# Patient Record
Sex: Male | Born: 1950
Health system: Southern US, Community
[De-identification: ages and names within clinical notes are randomized; demographics above are authoritative.]

## PROBLEM LIST (undated history)

## (undated) DIAGNOSIS — F32A Depression, unspecified: Secondary | ICD-10-CM

## (undated) DIAGNOSIS — M459 Ankylosing spondylitis of unspecified sites in spine: Secondary | ICD-10-CM

## (undated) DIAGNOSIS — I10 Essential (primary) hypertension: Secondary | ICD-10-CM

## (undated) DIAGNOSIS — F329 Major depressive disorder, single episode, unspecified: Secondary | ICD-10-CM

## (undated) DIAGNOSIS — E079 Disorder of thyroid, unspecified: Secondary | ICD-10-CM

## (undated) HISTORY — DX: Depression, unspecified: F32.A

## (undated) HISTORY — DX: Essential (primary) hypertension: I10

## (undated) HISTORY — DX: Major depressive disorder, single episode, unspecified: F32.9

## (undated) HISTORY — DX: Ankylosing spondylitis of unspecified sites in spine: M45.9

## (undated) HISTORY — PX: COLONOSCOPY: SHX174

## (undated) HISTORY — DX: Disorder of thyroid, unspecified: E07.9

---
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
## 1998-03-12 ENCOUNTER — Encounter: Payer: Self-pay | Admitting: Emergency Medicine

## 1998-03-12 ENCOUNTER — Emergency Department (HOSPITAL_COMMUNITY): Admission: EM | Admit: 1998-03-12 | Discharge: 1998-03-12 | Payer: Self-pay

## 1998-06-01 ENCOUNTER — Ambulatory Visit: Admission: RE | Admit: 1998-06-01 | Discharge: 1998-06-01 | Payer: Self-pay | Admitting: Internal Medicine

## 1998-07-20 ENCOUNTER — Emergency Department (HOSPITAL_COMMUNITY): Admission: EM | Admit: 1998-07-20 | Discharge: 1998-07-20 | Payer: Self-pay | Admitting: Emergency Medicine

## 2001-12-09 DIAGNOSIS — K648 Other hemorrhoids: Secondary | ICD-10-CM | POA: Insufficient documentation

## 2005-05-20 ENCOUNTER — Encounter: Admission: RE | Admit: 2005-05-20 | Discharge: 2005-05-20 | Payer: Self-pay | Admitting: Family Medicine

## 2005-08-03 ENCOUNTER — Ambulatory Visit: Payer: Self-pay | Admitting: Family Medicine

## 2005-09-24 ENCOUNTER — Ambulatory Visit: Payer: Self-pay | Admitting: Gastroenterology

## 2005-09-24 DIAGNOSIS — M459 Ankylosing spondylitis of unspecified sites in spine: Secondary | ICD-10-CM

## 2005-09-30 ENCOUNTER — Ambulatory Visit: Payer: Self-pay | Admitting: Gastroenterology

## 2005-09-30 DIAGNOSIS — K589 Irritable bowel syndrome without diarrhea: Secondary | ICD-10-CM | POA: Insufficient documentation

## 2006-10-28 ENCOUNTER — Ambulatory Visit: Payer: Self-pay | Admitting: Family Medicine

## 2006-11-22 ENCOUNTER — Encounter: Admission: RE | Admit: 2006-11-22 | Discharge: 2006-11-22 | Payer: Self-pay | Admitting: Family Medicine

## 2006-12-14 ENCOUNTER — Ambulatory Visit: Payer: Self-pay | Admitting: Gastroenterology

## 2007-04-12 DIAGNOSIS — G4733 Obstructive sleep apnea (adult) (pediatric): Secondary | ICD-10-CM

## 2007-04-12 DIAGNOSIS — F329 Major depressive disorder, single episode, unspecified: Secondary | ICD-10-CM

## 2007-04-12 DIAGNOSIS — I10 Essential (primary) hypertension: Secondary | ICD-10-CM

## 2007-04-12 DIAGNOSIS — Z87442 Personal history of urinary calculi: Secondary | ICD-10-CM

## 2007-07-01 ENCOUNTER — Ambulatory Visit: Payer: Self-pay | Admitting: Family Medicine

## 2008-04-19 ENCOUNTER — Ambulatory Visit: Payer: Self-pay | Admitting: Family Medicine

## 2008-05-28 ENCOUNTER — Ambulatory Visit: Payer: Self-pay | Admitting: Family Medicine

## 2009-01-22 ENCOUNTER — Ambulatory Visit: Payer: Self-pay | Admitting: Family Medicine

## 2009-02-06 IMAGING — CT CT ABDOMEN W/ CM
2 of 5 series · 17 of 46 positions shown, 19 images · IV contrast (30CC OMNI 350 & [ID] OMNI 300)
Comparison: None.

CLINICAL DATA: Abdominal pain, mid to left side.  
 CT ABDOMEN WITH CONTRAST:
TECHNIQUE: Multidetector CT imaging of the abdomen was performed following the standard protocol during bolus administration of intravenous contrast.
 Contrast:  685cc Omnipaque 300.

[Series 2: abdomen w/ · axial · 0.89mm/px · z∈[-269,+56]mm · 14 of 75 slices shown, 16 images]
[im 5/75  soft-tissue]
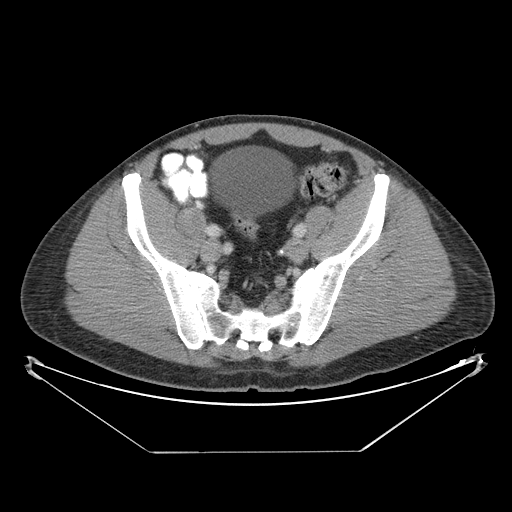
[im 5/75  bone]
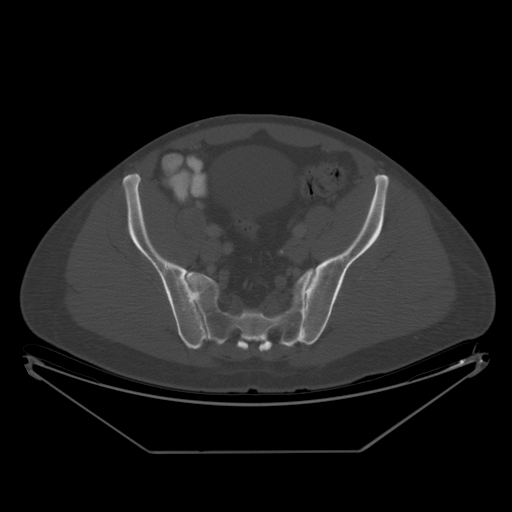
[im 9/75  soft-tissue]
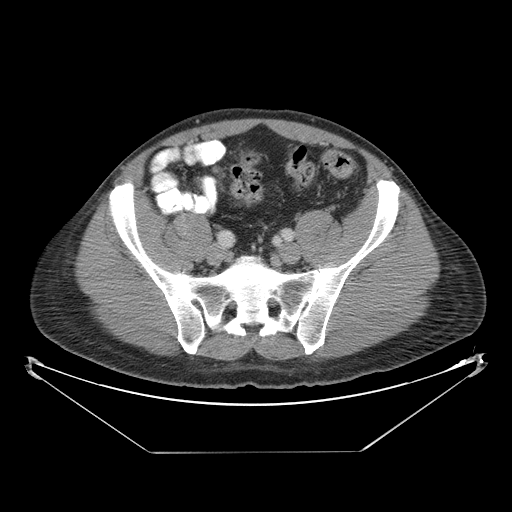
[im 14/75  soft-tissue]
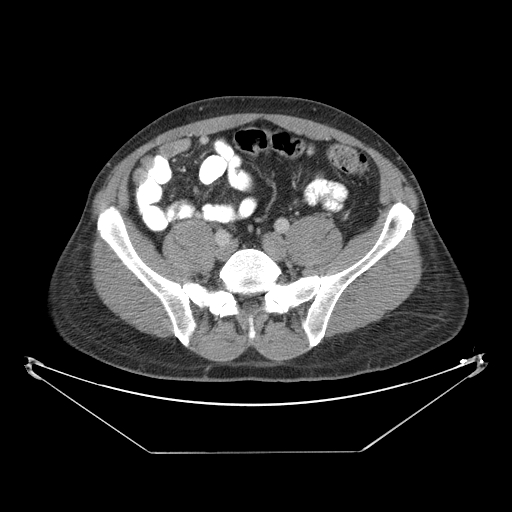
[im 22/75  soft-tissue]
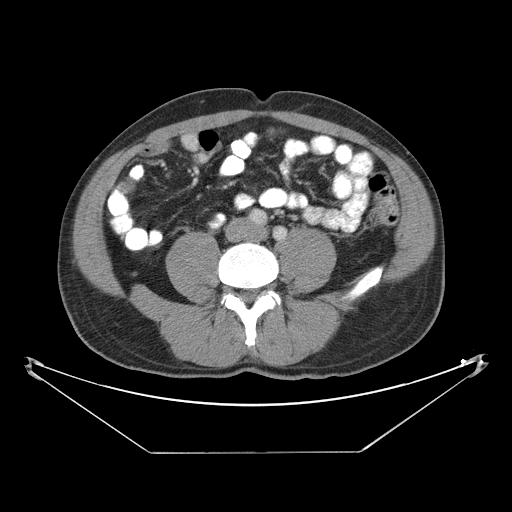
[im 27/75  soft-tissue]
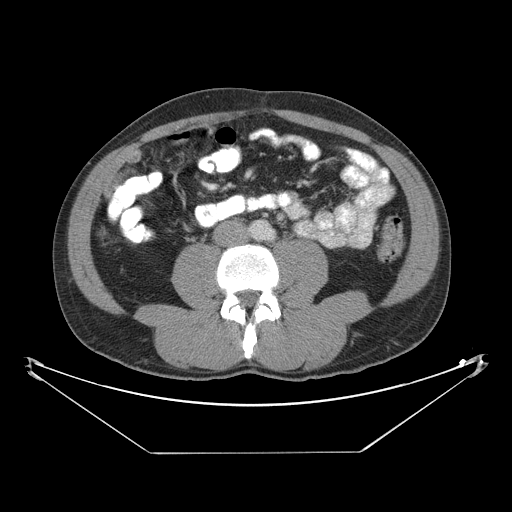
[im 31/75  soft-tissue]
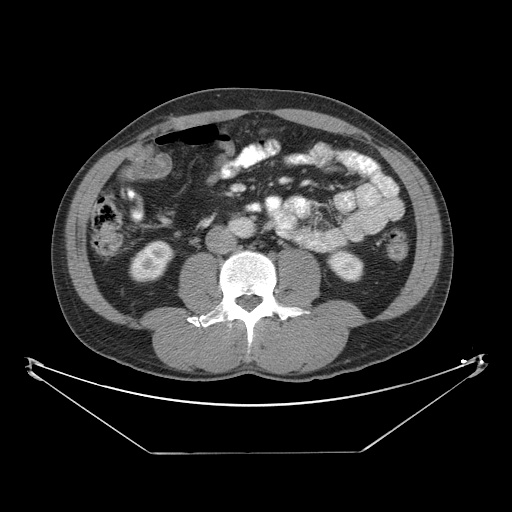
[im 35/75  soft-tissue]
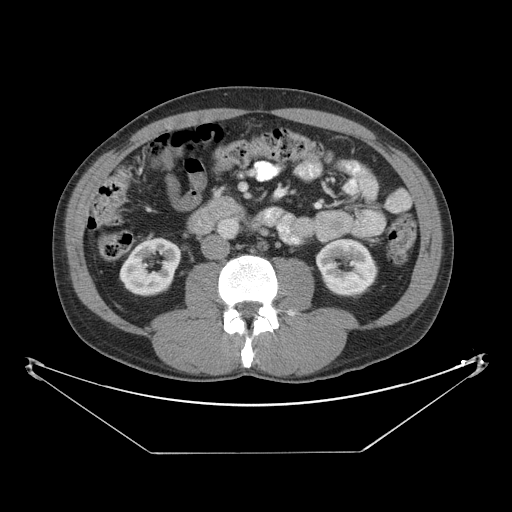
[im 40/75  soft-tissue]
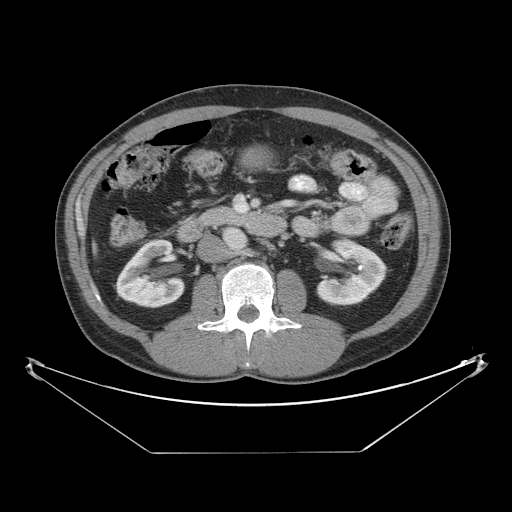
[im 44/75  soft-tissue]
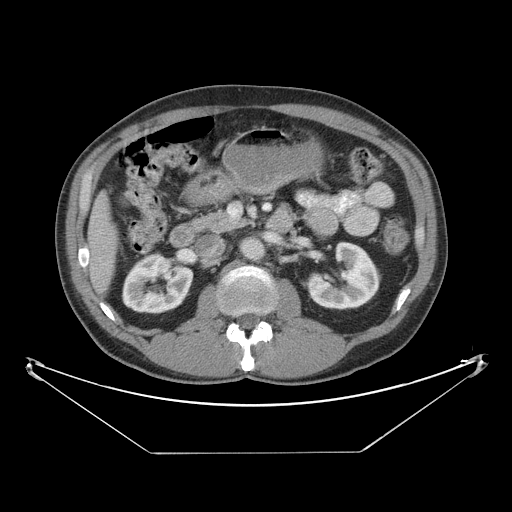
[im 44/75  bone]
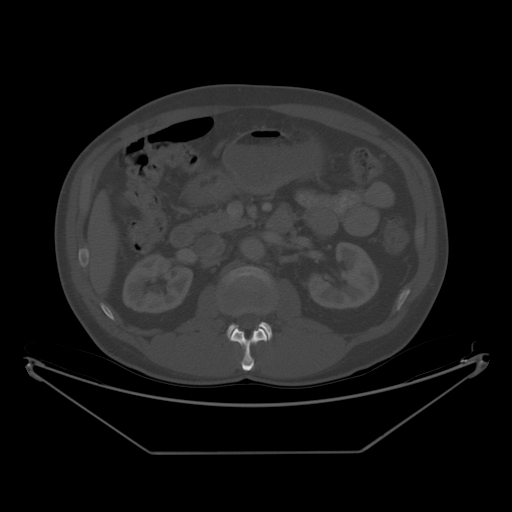
[im 48/75  soft-tissue]
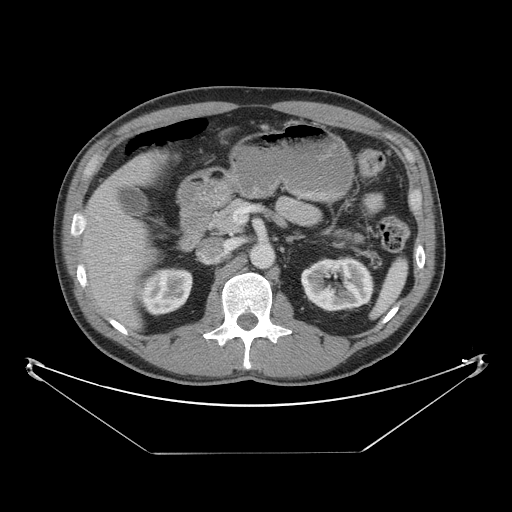
[im 57/75  soft-tissue]
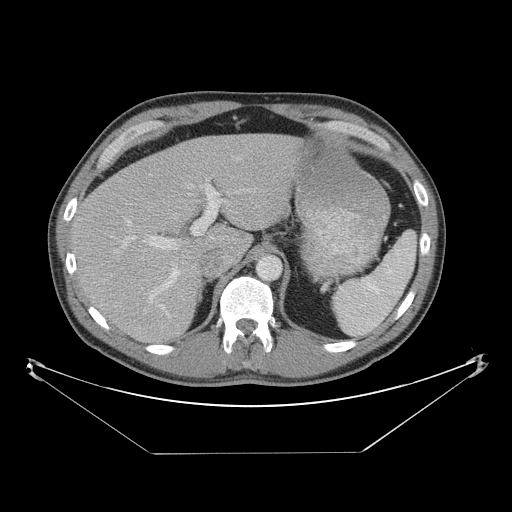
[im 61/75  soft-tissue]
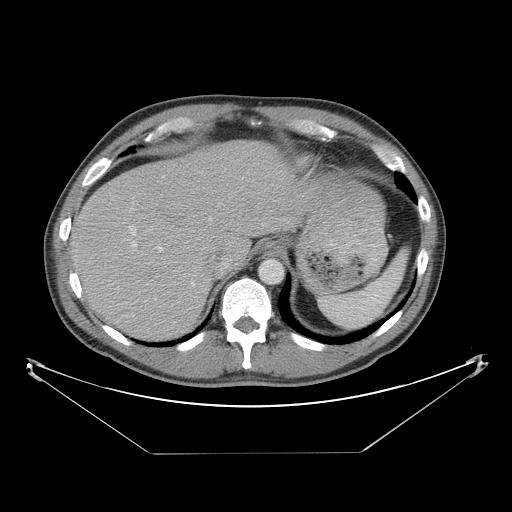
[im 66/75  soft-tissue]
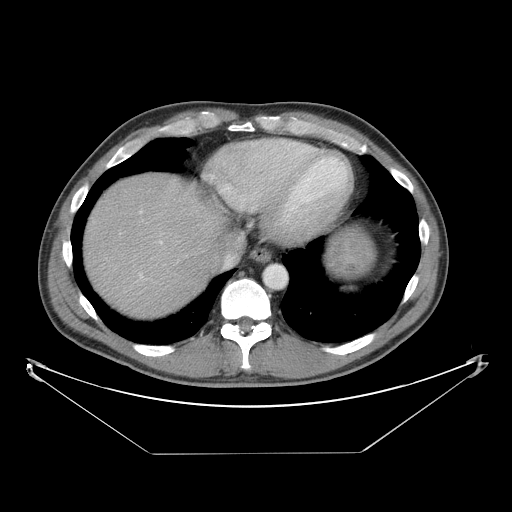
[im 70/75  soft-tissue]
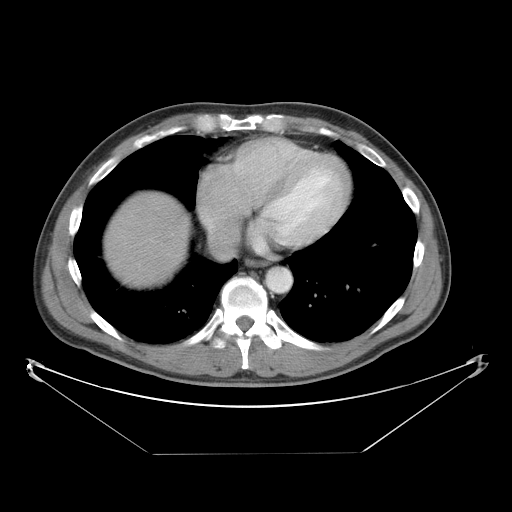

[Series 401: coronal · coronal · 0.88mm/px · 3 of 129 slices shown]
[im 43/129  soft-tissue]
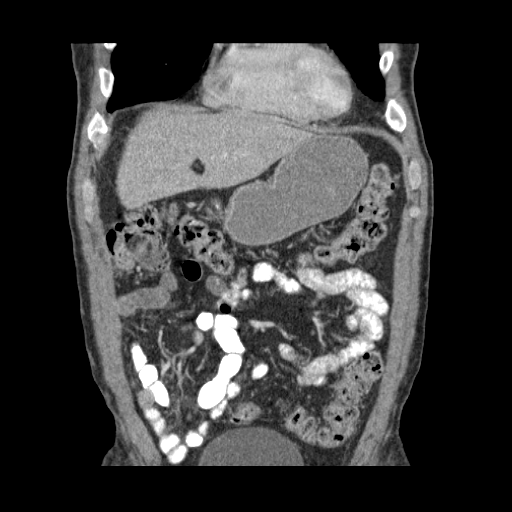
[im 57/129  soft-tissue]
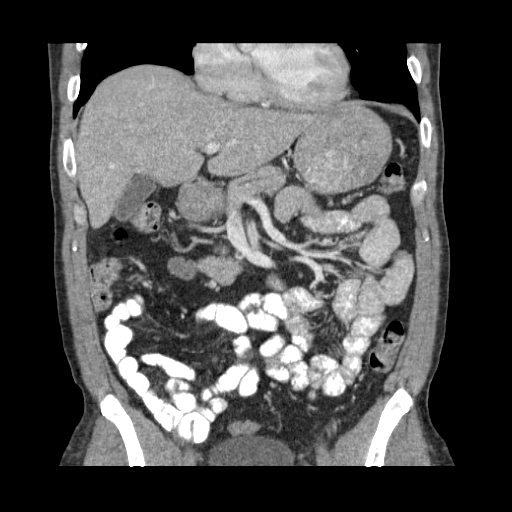
[im 72/129  soft-tissue]
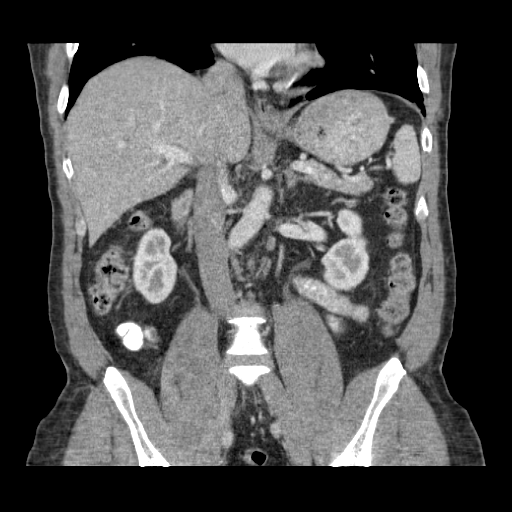

[17 of 46 positions shown; findings below may reference images not displayed]

FINDINGS: Lung bases are clear.  A 6mm non obstructing left renal calculus is identified.  Exophytic 9mm left kidney cyst is noted.  Minimal left upper renal pole cortical scarring noted.  No hydronephrosis on either side.  Abdominal viscera otherwise unremarkable.  No calculus is seen along the visualized aspect of either ureter; neither ureter is completely imaged in its most distal aspect because only abdomen CT was done per the written order.  Visualized bowel is unremarkable.  Remote right L3 transverse process fracture vs congenital defect incidentally noted.
IMPRESSION: 1.  No acute intra-abdominal finding. 
 2.  Non obstructing 6mm left renal calculus.

## 2009-02-21 ENCOUNTER — Ambulatory Visit: Payer: Self-pay | Admitting: Family Medicine

## 2009-03-20 ENCOUNTER — Ambulatory Visit: Payer: Self-pay | Admitting: Family Medicine

## 2009-06-17 ENCOUNTER — Ambulatory Visit: Payer: Self-pay | Admitting: Family Medicine

## 2009-08-20 ENCOUNTER — Ambulatory Visit: Payer: Self-pay | Admitting: Family Medicine

## 2010-05-11 ENCOUNTER — Ambulatory Visit (INDEPENDENT_AMBULATORY_CARE_PROVIDER_SITE_OTHER): Payer: BC Managed Care – PPO

## 2010-05-11 ENCOUNTER — Inpatient Hospital Stay (INDEPENDENT_AMBULATORY_CARE_PROVIDER_SITE_OTHER)
Admission: RE | Admit: 2010-05-11 | Discharge: 2010-05-11 | Disposition: A | Discharge status: Home / Self Care | Payer: BC Managed Care – PPO | Source: Ambulatory Visit | Attending: Family Medicine | Admitting: Family Medicine

## 2010-05-11 DIAGNOSIS — S63509A Unspecified sprain of unspecified wrist, initial encounter: Secondary | ICD-10-CM

## 2010-07-01 NOTE — Assessment & Plan Note (Signed)
[name]                         GASTROENTEROLOGY OFFICE NOTE   [name], [name]                   MRN:          [id number]  DATE:12/14/2006                            DOB:          04-Oct-1950    [name] [name] continues to have dull, aching discomfort in his left upper  quadrant area, which is only relieved by external massage.  He really  denies bowel irregularity, rectal bleeding, or any upper GI or  hepatobiliary complaints.  His last colonoscopy was a year ago and was  unremarkable.  He saw his primary care physician, Dr. Susann Givens, who  obtained CT scan of the abdomen on November 22, 2006, at [facility].  This was entirely normal, except for a 6 mm left renal  calculus.   [name] [name] does suffer from ankylosing spondylitis and is down to 75 mg a day  of Indocin.  He also is on triamterene/HCTZ and, I believe, lisinopril.   He weighs 220 pounds.  Blood pressure 120/84, and pulse of 74 and  regular.  He is a healthy-appearing white male in no distress.  His abdominal exam showed no organomegaly, masses, but there was some  tenderness to deep palpation over a mobile sigmoid colon.  Bowel sounds  were normal.  Rectal exam was deferred.   ASSESSMENT:  [name] definitely has tenderness over his sigmoid colon,  which has been a chronic problem.  In the past, there has been some  question of inflammatory bowel disease, but this has not proved to be  the case with several recent colonoscopies, which have been negative.  It is certainly possible that he has chronic NSAID damage to his lower  colon area, versus an atypical irritable bowel syndrome presentation.   RECOMMENDATIONS:  1. Continue high-fiber diet.  Will try daily Benefiber one teaspoon      with juice to see if, by distending sigmoid colon we can decrease      the tension in the wall of the sigmoid area as per Laplace's Law.  2. Trial of Pamine Forte 5 mg q.a.m. and twice a day as  tolerated.  I      have counseled Mr. Service about anticholinergic side effects that may      require him to reduce the dose of this medication.  3. Consider other medications as per Dr. Susann Givens.     Vania Rea. Jarold Motto, MD, Caleen Essex, FAGA  Electronically Signed    DRP/MedQ  DD: 12/14/2006  DT: 12/15/2006  Job #: [id number]   cc:   Sharlot Gowda, M.D.

## 2010-07-04 NOTE — Assessment & Plan Note (Signed)
[facility]                           GASTROENTEROLOGY OFFICE NOTE   LAZLO, TUNNEY                           MRN:          [id number]  DATE:09/24/2005                            DOB:          [birth year]    HISTORY OF PRESENT ILLNESS:  Mr. Bozman is a 60 year old white male who I have  seen for several years because of a nonspecific colitis associated with his  ankylosing spondylitis.  He really has been doing well except for occasional  spasmodic left lower quadrant pain.  He comes in for followup exam.  He  continues to have some IBS symptoms but is having regular bowel movements.  He is followed by Dr. Estill Bakes for his spondylitis.  He takes an Indocin  tablet once a day.  He denies rectal bleeding, upper gastrointestinal or  hepatobiliary problems.  His last colonoscopy was in October of 2003 and was  unremarkable.  His mother did have colon cancer at age 73.   PAST MEDICAL HISTORY:  Remarkable for essential hypertension.  He is on  several antihypertensive's per Dr. Susann Givens. He did not bring his list of  medications today.  He also has chronic depression and takes Effexor on a  regular basis.  He also has had recurrent kidney stones, previous hernia  surgery, appendectomy.   FAMILY HISTORY:  Otherwise noncontributory.   SOCIAL HISTORY:  The patient is married and lives with his wife.  He does  not smoke.  He uses ethanol socially.   REVIEW OF SYSTEMS:  Otherwise noncontributory except for chronic back pain  associated with his spondylitis.  He denies CARDIOVASCULAR, PULMONARY,  GENITOURINARY, NEUROLOGIC, or PSYCHIATRIC problems other than his  depression.   PHYSICAL EXAMINATION:  GENERAL APPEARANCE:  Exam shows him to be a healthy-  appearing white male in no distress.  I cannot appreciate stigmata of  chronic liver disease.  CHEST:  Clear to auscultation and percussion.  There are no murmurs, gallops  or rubs on cardiac exam.  He appears  to be in a regular rhythm.  ABDOMEN:  Showed no hepatosplenomegaly, masses or tenderness.  Bowel sounds  are normal.  EXTREMITIES:  Peripheral extremities are unremarkable.  VITAL SIGNS:  He is 6 feet tall an weighs 226 pounds.  Blood pressure  110/60. Pulse is 56 and regular.   ASSESSMENT:  1. Ankylosing spondylitis, doing well on the Indocin therapy in low doses.  2. Probable irritable bowel syndrome.  3. Strong family history of colon carcinoma at a young age in his mother.  4. Essential hypertension.  5. Chronic depression on Effexor therapy.   RECOMMENDATIONS:  1. P.r.n. Levsin offered to the patient but he does not want to try that      at this time.  2. Outpatient colonoscopy examination.  3. Continue medications as listed above.                                   Vania Rea. Jarold Motto, MD, Caleen Essex   DRP/MedQ  DD:  09/24/2005  DT:  09/24/2005  Job #:  [id number]   cc:   Sharlot Gowda, MD  Areatha Keas, MD

## 2010-07-26 ENCOUNTER — Other Ambulatory Visit: Payer: Self-pay | Admitting: Family Medicine

## 2010-08-29 ENCOUNTER — Other Ambulatory Visit: Payer: Self-pay | Admitting: Family Medicine

## 2010-08-29 ENCOUNTER — Other Ambulatory Visit: Payer: Self-pay | Admitting: *Deleted

## 2010-08-29 MED ORDER — CYCLOBENZAPRINE HCL 10 MG PO TABS: 10.0000 mg | ORAL_TABLET | Freq: Two times a day (BID) | ORAL | Status: AC | PRN

## 2010-08-29 NOTE — Telephone Encounter (Signed)
Called pt left message he needed to make med check appt

## 2010-08-29 NOTE — Telephone Encounter (Signed)
Called Flexeril 10 mg 1 po BID prn #30 with [facility] refills to [facility] at [phone] per JCL.  CM, LPN

## 2010-08-29 NOTE — Telephone Encounter (Signed)
He needs an appointment for a medication check and make sure he does not run out of his medicine

## 2010-09-11 ENCOUNTER — Encounter: Payer: Self-pay | Admitting: Family Medicine

## 2010-09-15 ENCOUNTER — Encounter: Payer: Self-pay | Admitting: Family Medicine

## 2010-09-15 ENCOUNTER — Ambulatory Visit (INDEPENDENT_AMBULATORY_CARE_PROVIDER_SITE_OTHER): Payer: BC Managed Care – PPO | Admitting: Family Medicine

## 2010-09-15 DIAGNOSIS — N529 Male erectile dysfunction, unspecified: Secondary | ICD-10-CM

## 2010-09-15 DIAGNOSIS — Z0000 Encounter for general adult medical examination without abnormal findings: Secondary | ICD-10-CM

## 2010-09-15 LAB — POCT URINALYSIS DIPSTICK
Ketones, UA: NEGATIVE
Leukocytes, UA: NEGATIVE
Protein, UA: NEGATIVE
Urobilinogen, UA: NEGATIVE
pH, UA: 5

## 2010-09-15 MED ORDER — VENLAFAXINE HCL 75 MG PO TABS: 75.0000 mg | ORAL_TABLET | Freq: Two times a day (BID) | ORAL | Status: DC

## 2010-09-15 MED ORDER — LEVOTHYROXINE SODIUM 100 MCG PO TABS: 100.0000 ug | ORAL_TABLET | Freq: Every day | ORAL | Status: DC

## 2010-09-15 MED ORDER — LOSARTAN POTASSIUM-HCTZ 50-12.5 MG PO TABS: 1.0000 | ORAL_TABLET | Freq: Every day | ORAL | Status: DC

## 2010-09-15 MED ORDER — AMLODIPINE BESYLATE 5 MG PO TABS: 5.0000 mg | ORAL_TABLET | Freq: Every day | ORAL | Status: DC

## 2010-09-15 MED ORDER — INDOMETHACIN 50 MG PO CAPS: 50.0000 mg | ORAL_CAPSULE | Freq: Two times a day (BID) | ORAL | Status: DC

## 2010-09-15 NOTE — Progress Notes (Signed)
  Subjective:    Patient ID: [name] [name], male    DOB: 06-17-1950, 60 y.o.   MRN: [id number]  HPI He is here for a complete examination. He has underlying Crohn's disease and presently is having no difficulty with this. His ankylosing spondylitis seems to be under good control. He continues on Indocin for this. He also has a history of migraine headaches and uses Effexor for this and for her underlying dysthymia. Presently he is not using CPAP because he could not tolerate it. His last colonoscopy was 2007. His immunizations are up-to-date. He did have blood work done through work and it was reviewed. Thyroid function is normal. Lipid panel did show elevated LDL at 151. His work and home life are going well. He does not smoke and takes socially. Keeps himself active.   Review of Systems  Constitutional: Negative.   HENT: Negative.   Eyes: Negative.   Respiratory: Negative.   Cardiovascular: Negative.   Gastrointestinal: Negative.   Genitourinary: Negative.   Musculoskeletal: Positive for back pain and arthralgias.  Skin: Negative.        Objective:   Physical Exam BP 120/84  Pulse 88  Ht 6\' 2"  (1.88 m)  Wt 252 lb (114.306 kg)  BMI 32.35 kg/m2  General Appearance:    Alert, cooperative, no distress, appears stated age  Head:    Normocephalic, without obvious abnormality, atraumatic  Eyes:    PERRL, conjunctiva/corneas clear, EOM's intact, fundi    benign  Ears:    Normal TM's and external ear canals  Nose:   Nares normal, mucosa normal, no drainage or sinus   tenderness  Throat:   Lips, mucosa, and tongue normal; teeth and gums normal  Neck:   Supple, no lymphadenopathy;  thyroid:  no   enlargement/tenderness/nodules; no carotid   bruit or JVD  Back:    Spine nontender, no curvature, ROM normal, no CVA     tenderness  Lungs:     Clear to auscultation bilaterally without wheezes, rales or     ronchi; respirations unlabored  Chest Wall:    No tenderness or deformity   Heart:    Regular rate and rhythm, S1 and S2 normal, no murmur, rub   or gallop  Breast Exam:    No chest wall tenderness, masses or gynecomastia  Abdomen:     Soft, non-tender, nondistended, normoactive bowel sounds,    no masses, no hepatosplenomegaly  Genitalia:    Normal male external genitalia without lesions.  Testicles without masses.  No inguinal hernias.  Rectal:    Normal sphincter tone, no masses or tenderness; guaiac negative stool.  Prostate smooth, no nodules, not enlarged.  Extremities:   No clubbing, cyanosis or edema  Pulses:   2+ and symmetric all extremities  Skin:   Skin color, texture, turgor normal, no rashes or lesions  Lymph nodes:   Cervical, supraclavicular, and axillary nodes normal  Neurologic:   CNII-XII intact, normal strength, sensation and gait; reflexes 2+ and symmetric throughout          Psych:   Normal mood, affect, hygiene and grooming.           Assessment & Plan:  Crohn's disease. Ankylosing spondylitis. Migraine headaches. Hypertension. Family history of colon cancer. Sleep apnea. Migraine history. Hypothyroid. Continue present medication regimen.

## 2010-09-19 NOTE — Progress Notes (Signed)
  Subjective:    Patient ID: [name] [name], male    DOB: 09-07-1950, 60 y.o.   MRN: [id number]  HPI Continues to intermittently use Cialis and is having no difficulty with this.   Review of Systems     Objective:   Physical Exam        Assessment & Plan:  ED. Continue on when necessary use of Cialis

## 2010-10-04 ENCOUNTER — Other Ambulatory Visit: Payer: Self-pay | Admitting: Family Medicine

## 2010-10-06 NOTE — Telephone Encounter (Signed)
Is this ok?

## 2010-11-24 ENCOUNTER — Encounter: Payer: Self-pay | Admitting: Gastroenterology

## 2011-05-22 ENCOUNTER — Telehealth: Payer: Self-pay | Admitting: Family Medicine

## 2011-05-22 ENCOUNTER — Other Ambulatory Visit: Payer: Self-pay | Admitting: Medical

## 2011-05-22 NOTE — Telephone Encounter (Signed)
Left message on cell and home to advise rx filled and apologized for the delay.

## 2011-05-22 NOTE — Telephone Encounter (Signed)
Call in imitrex 100 mg #6. One PRN migraine

## 2011-05-22 NOTE — Telephone Encounter (Signed)
Needs refill generic Imitrex , pt would like ASAP trying to head off a migraine, current Rx has expired   [facility] [place]  [phone]

## 2011-06-30 ENCOUNTER — Other Ambulatory Visit: Payer: Self-pay | Admitting: Family Medicine

## 2011-07-01 NOTE — Telephone Encounter (Signed)
Imitrex renewed 

## 2011-07-01 NOTE — Telephone Encounter (Signed)
Is this ok?

## 2011-09-21 ENCOUNTER — Other Ambulatory Visit: Payer: Self-pay | Admitting: Family Medicine

## 2011-10-07 ENCOUNTER — Ambulatory Visit (INDEPENDENT_AMBULATORY_CARE_PROVIDER_SITE_OTHER): Payer: BC Managed Care – PPO | Admitting: Family Medicine

## 2011-10-07 ENCOUNTER — Encounter: Payer: Self-pay | Admitting: Family Medicine

## 2011-10-07 VITALS — BP 150/92 | HR 70 | Ht 72.0 in | Wt 225.0 lb

## 2011-10-07 DIAGNOSIS — F3289 Other specified depressive episodes: Secondary | ICD-10-CM

## 2011-10-07 DIAGNOSIS — E039 Hypothyroidism, unspecified: Secondary | ICD-10-CM | POA: Insufficient documentation

## 2011-10-07 DIAGNOSIS — I10 Essential (primary) hypertension: Secondary | ICD-10-CM

## 2011-10-07 DIAGNOSIS — M459 Ankylosing spondylitis of unspecified sites in spine: Secondary | ICD-10-CM

## 2011-10-07 DIAGNOSIS — K589 Irritable bowel syndrome without diarrhea: Secondary | ICD-10-CM

## 2011-10-07 DIAGNOSIS — F329 Major depressive disorder, single episode, unspecified: Secondary | ICD-10-CM

## 2011-10-07 DIAGNOSIS — G43009 Migraine without aura, not intractable, without status migrainosus: Secondary | ICD-10-CM | POA: Insufficient documentation

## 2011-10-07 DIAGNOSIS — Z2911 Encounter for prophylactic immunotherapy for respiratory syncytial virus (RSV): Secondary | ICD-10-CM

## 2011-10-07 DIAGNOSIS — Z0000 Encounter for general adult medical examination without abnormal findings: Secondary | ICD-10-CM

## 2011-10-07 DIAGNOSIS — G4730 Sleep apnea, unspecified: Secondary | ICD-10-CM

## 2011-10-07 DIAGNOSIS — G43909 Migraine, unspecified, not intractable, without status migrainosus: Secondary | ICD-10-CM

## 2011-10-07 LAB — POCT URINALYSIS DIPSTICK
Bilirubin, UA: NEGATIVE
Glucose, UA: NEGATIVE
Ketones, UA: NEGATIVE
Leukocytes, UA: NEGATIVE
pH, UA: 7

## 2011-10-07 MED ORDER — LEVOTHYROXINE SODIUM 100 MCG PO TABS: 100.0000 ug | ORAL_TABLET | Freq: Every day | ORAL | Status: DC

## 2011-10-07 MED ORDER — AMLODIPINE BESYLATE 5 MG PO TABS: 5.0000 mg | ORAL_TABLET | Freq: Every day | ORAL | Status: DC

## 2011-10-07 MED ORDER — VENLAFAXINE HCL 75 MG PO TABS: 75.0000 mg | ORAL_TABLET | Freq: Every day | ORAL | Status: DC

## 2011-10-07 MED ORDER — INDOMETHACIN 50 MG PO CAPS: 50.0000 mg | ORAL_CAPSULE | Freq: Two times a day (BID) | ORAL | Status: DC

## 2011-10-07 MED ORDER — LOSARTAN POTASSIUM-HCTZ 50-12.5 MG PO TABS: 1.0000 | ORAL_TABLET | Freq: Every day | ORAL | Status: DC

## 2011-10-07 NOTE — Progress Notes (Signed)
Subjective:    Patient ID: [name] [name], male    DOB: 12/22/1950, 61 y.o.   MRN: [id number]  HPI He is here for a complete examination. He does have an underlying history of ankylosing spondylitis and is doing well with use of Indocin. He also has a previous history of Crohn's disease however turned out to be irritable bowel not Crohn's. He continues to do well on Effexor for treatment of underlying dysthymic condition as well as for his migraines. His migraine are under good control with present medicine and occasional use of Imitrex. He has a previous history of sleep apnea although the AHI was 8. He has lost some weight since then and states he is sleeping much better. He continues on his thyroid medication. His work is going well. His home life is somewhat stressful now that his brother-in-law is living with them apparently short-term. He did have blood work done where he works and will send that information to me.  Review of Systems Negative except as above    Objective:   Physical Exam BP 150/92  Pulse 70  Ht 6' (1.829 m)  Wt 225 lb (102.059 kg)  BMI 30.52 kg/m2  SpO2 97%  General Appearance:    Alert, cooperative, no distress, appears stated age  Head:    Normocephalic, without obvious abnormality, atraumatic  Eyes:    PERRL, conjunctiva/corneas clear, EOM's intact, fundi    benign  Ears:    Normal TM's and external ear canals  Nose:   Nares normal, mucosa normal, no drainage or sinus   tenderness  Throat:   Lips, mucosa, and tongue normal; teeth and gums normal  Neck:   Supple, no lymphadenopathy;  thyroid:  no   enlargement/tenderness/nodules; no carotid   bruit or JVD  Back:    Spine nontender, no curvature, ROM normal, no CVA     tenderness  Lungs:     Clear to auscultation bilaterally without wheezes, rales or     ronchi; respirations unlabored  Chest Wall:    No tenderness or deformity   Heart:    Regular rate and rhythm, S1 and S2 normal, no murmur, rub   or  gallop  Breast Exam:    No chest wall tenderness, masses or gynecomastia  Abdomen:     Soft, non-tender, nondistended, normoactive bowel sounds,    no masses, no hepatosplenomegaly  Genitalia:   deferred   Rectal:   deferred   Extremities:   No clubbing, cyanosis or edema  Pulses:   2+ and symmetric all extremities  Skin:   Skin color, texture, turgor normal, no rashes or lesions  Lymph nodes:   Cervical, supraclavicular, and axillary nodes normal  Neurologic:   CNII-XII intact, normal strength, sensation and gait; reflexes 2+ and symmetric throughout          Psych:   Normal mood, affect, hygiene and grooming.           Assessment & Plan:  Counseled on Zostavax immunization 1. Routine general medical examination at a health care facility  POCT Urinalysis Dipstick, HM COLONOSCOPY, Varicella-zoster vaccine subcutaneous  2. SPONDYLITIS, ANKYLOSING  indomethacin (INDOCIN) 50 MG capsule  3. Irritable bowel syndrome  HM COLONOSCOPY  4. DEPRESSION  venlafaxine (EFFEXOR) 75 MG tablet  5. Migraine headache    6. Sleep apnea    7. Hypertension  losartan-hydrochlorothiazide (HYZAAR) 50-12.5 MG per tablet, amLODipine (NORVASC) 5 MG tablet  8. Hypothyroid  levothyroxine (SYNTHROID, LEVOTHROID) 100 MCG tablet   he  is to keep track of his blood pressure and call me in one month. We might need to readjust his medications.

## 2011-10-07 NOTE — Patient Instructions (Signed)
Check your blood pressure in about a month and let me know

## 2011-10-25 ENCOUNTER — Other Ambulatory Visit: Payer: Self-pay | Admitting: Family Medicine

## 2011-11-06 ENCOUNTER — Encounter: Payer: Self-pay | Admitting: Gastroenterology

## 2011-11-11 ENCOUNTER — Telehealth: Payer: Self-pay | Admitting: Family Medicine

## 2011-11-11 NOTE — Telephone Encounter (Signed)
Pt called and stated that he was to call back and let you know what his bp readings have been. He states that they have been consistently 155/95. He also states that occasionally he has felt a flushed feeling. Pt was offered a appt but he declined saying he would rather talk to you. Please advise as how you want to handle this.

## 2011-11-11 NOTE — Telephone Encounter (Signed)
Have him double the Norvasc and recheck in one month

## 2011-11-12 ENCOUNTER — Telehealth: Payer: Self-pay | Admitting: Family Medicine

## 2011-11-12 NOTE — Telephone Encounter (Signed)
Pt informed

## 2011-11-13 NOTE — Telephone Encounter (Signed)
phd pt cell lmtrc and wk no ans.

## 2011-11-23 ENCOUNTER — Encounter: Payer: Self-pay | Admitting: Gastroenterology

## 2011-11-28 ENCOUNTER — Other Ambulatory Visit: Payer: Self-pay | Admitting: Family Medicine

## 2011-11-30 NOTE — Telephone Encounter (Signed)
Is this ok?

## 2011-12-02 ENCOUNTER — Telehealth: Payer: Self-pay | Admitting: Family Medicine

## 2011-12-02 ENCOUNTER — Other Ambulatory Visit: Payer: Self-pay | Admitting: Medical

## 2011-12-02 MED ORDER — AMLODIPINE BESYLATE 10 MG PO TABS: 10.0000 mg | ORAL_TABLET | Freq: Every day | ORAL | Status: DC

## 2011-12-02 NOTE — Telephone Encounter (Signed)
i sent 10mg  dose to pharmacy.

## 2011-12-04 NOTE — Telephone Encounter (Signed)
LMOM NOTIFYING THE PATIENT THAT WE SENT IN A DIFFERENT DOSE ON HIS MEDICATIONS. CLS

## 2011-12-14 ENCOUNTER — Encounter: Payer: Self-pay | Admitting: Gastroenterology

## 2011-12-14 ENCOUNTER — Ambulatory Visit (AMBULATORY_SURGERY_CENTER): Payer: BC Managed Care – PPO | Admitting: *Deleted

## 2011-12-14 VITALS — Ht 72.0 in | Wt 226.0 lb

## 2011-12-14 DIAGNOSIS — Z1211 Encounter for screening for malignant neoplasm of colon: Secondary | ICD-10-CM

## 2011-12-14 MED ORDER — MOVIPREP 100 G PO SOLR: ORAL | Status: DC

## 2011-12-28 ENCOUNTER — Ambulatory Visit (AMBULATORY_SURGERY_CENTER): Payer: BC Managed Care – PPO | Admitting: Gastroenterology

## 2011-12-28 ENCOUNTER — Encounter: Payer: Self-pay | Admitting: Gastroenterology

## 2011-12-28 VITALS — BP 146/88 | HR 73 | Temp 97.1°F | Resp 13 | Ht 72.0 in | Wt 226.0 lb

## 2011-12-28 DIAGNOSIS — Z800 Family history of malignant neoplasm of digestive organs: Secondary | ICD-10-CM

## 2011-12-28 DIAGNOSIS — Z1211 Encounter for screening for malignant neoplasm of colon: Secondary | ICD-10-CM

## 2011-12-28 MED ORDER — SODIUM CHLORIDE 0.9 % IV SOLN: 500.0000 mL | INTRAVENOUS | Status: DC

## 2011-12-28 NOTE — Progress Notes (Signed)
Patient did not experience any of the following events: a burn prior to discharge; a fall within the facility; wrong site/side/patient/procedure/implant event; or a hospital transfer or hospital admission upon discharge from the facility. (G8907) Patient did not have preoperative order for IV antibiotic SSI prophylaxis. (G8918)  

## 2011-12-28 NOTE — Patient Instructions (Addendum)

## 2011-12-28 NOTE — Op Note (Signed)
[facility] [address] [place], [address]   COLONOSCOPY PROCEDURE REPORT  PATIENT: [name] [name]  MR#: [id number] BIRTHDATE: Jul 10, 1950 , 61  yrs. old GENDER: Male ENDOSCOPIST: Mardella Layman, MD, Clementeen Graham REFERRED BY:  Sharlot Gowda, M.D. PROCEDURE DATE:  12/28/2011 PROCEDURE:   Colonoscopy, surveillance ASA CLASS:   Class II INDICATIONS:patient's immediate family history of colon cancer. MEDICATIONS: propofol (Diprivan) 200mg  IV  DESCRIPTION OF PROCEDURE:   After the risks and benefits and of the procedure were explained, informed consent was obtained.  A digital rectal exam revealed no abnormalities of the rectum.    The LB [id number]  endoscope was introduced through the anus and advanced to the cecum, which was identified by both the appendix and ileocecal valve .  The quality of the prep was good, using MoviPrep .  The instrument was then slowly withdrawn as the colon was fully examined.     COLON FINDINGS: A normal appearing cecum, ileocecal valve, and appendiceal orifice were identified.  The ascending, hepatic flexure, transverse, splenic flexure, descending, sigmoid colon and rectum appeared unremarkable.  No polyps or cancers were seen. Retroflexed views revealed no abnormalities.     The scope was then withdrawn from the patient and the procedure completed.  COMPLICATIONS: There were no complications. ENDOSCOPIC IMPRESSION: Normal colon ...no polyps or cancer...++ FH colon CA in his mother  RECOMMENDATIONS: Given your significant family history of colon cancer, you should have a repeat colonoscopy in 5 years   REPEAT EXAM:  cc:  _______________________________ eSignedMardella Layman, MD, [facility] 12/28/2011 10:28 AM

## 2011-12-29 ENCOUNTER — Telehealth: Payer: Self-pay | Admitting: *Deleted

## 2011-12-29 NOTE — Telephone Encounter (Signed)
Got a beep x2 attempted to leave message.

## 2012-02-03 ENCOUNTER — Other Ambulatory Visit: Payer: Self-pay | Admitting: Medical

## 2012-02-13 ENCOUNTER — Other Ambulatory Visit: Payer: Self-pay | Admitting: Medical

## 2012-03-25 ENCOUNTER — Ambulatory Visit (INDEPENDENT_AMBULATORY_CARE_PROVIDER_SITE_OTHER): Payer: BC Managed Care – PPO | Admitting: Family Medicine

## 2012-03-25 ENCOUNTER — Encounter: Payer: Self-pay | Admitting: Family Medicine

## 2012-03-25 VITALS — BP 150/90 | HR 89 | Wt 241.0 lb

## 2012-03-25 DIAGNOSIS — Z23 Encounter for immunization: Secondary | ICD-10-CM

## 2012-03-25 DIAGNOSIS — Z79899 Other long term (current) drug therapy: Secondary | ICD-10-CM

## 2012-03-25 DIAGNOSIS — I10 Essential (primary) hypertension: Secondary | ICD-10-CM

## 2012-03-25 MED ORDER — LOSARTAN POTASSIUM-HCTZ 100-12.5 MG PO TABS: 1.0000 | ORAL_TABLET | Freq: Every day | ORAL | Status: DC

## 2012-03-25 NOTE — Patient Instructions (Signed)
Give me a call in a month or 2 and let me know what the readings are

## 2012-03-25 NOTE — Progress Notes (Signed)
  Subjective:    Patient ID: [name] [name], male    DOB: 1950-11-06, 62 y.o.   MRN: [id number]  HPI He is here to discuss his blood pressure. He has been monitoring this and it has been hovering in the 140-150/9200 range.   Review of Systems     Objective:   Physical Exam [name] and in no distress. Blood pressure is recorded.       Assessment & Plan:   1. HYPERTENSION   2. Encounter for long-term (current) use of other medications    I decided to increase his ARB. He will monitor his blood pressure and call me in one or 2 months.

## 2012-04-29 ENCOUNTER — Encounter: Payer: Self-pay | Admitting: Family Medicine

## 2012-07-26 IMAGING — CR DG WRIST COMPLETE 3+V*R*
2 series · 2 of 2 positions shown · non-contrast
Comparison: None.

CLINICAL DATA: Fell, pain

RIGHT WRIST - COMPLETE 3+ VIEW

[view not recorded (1 of 2)]
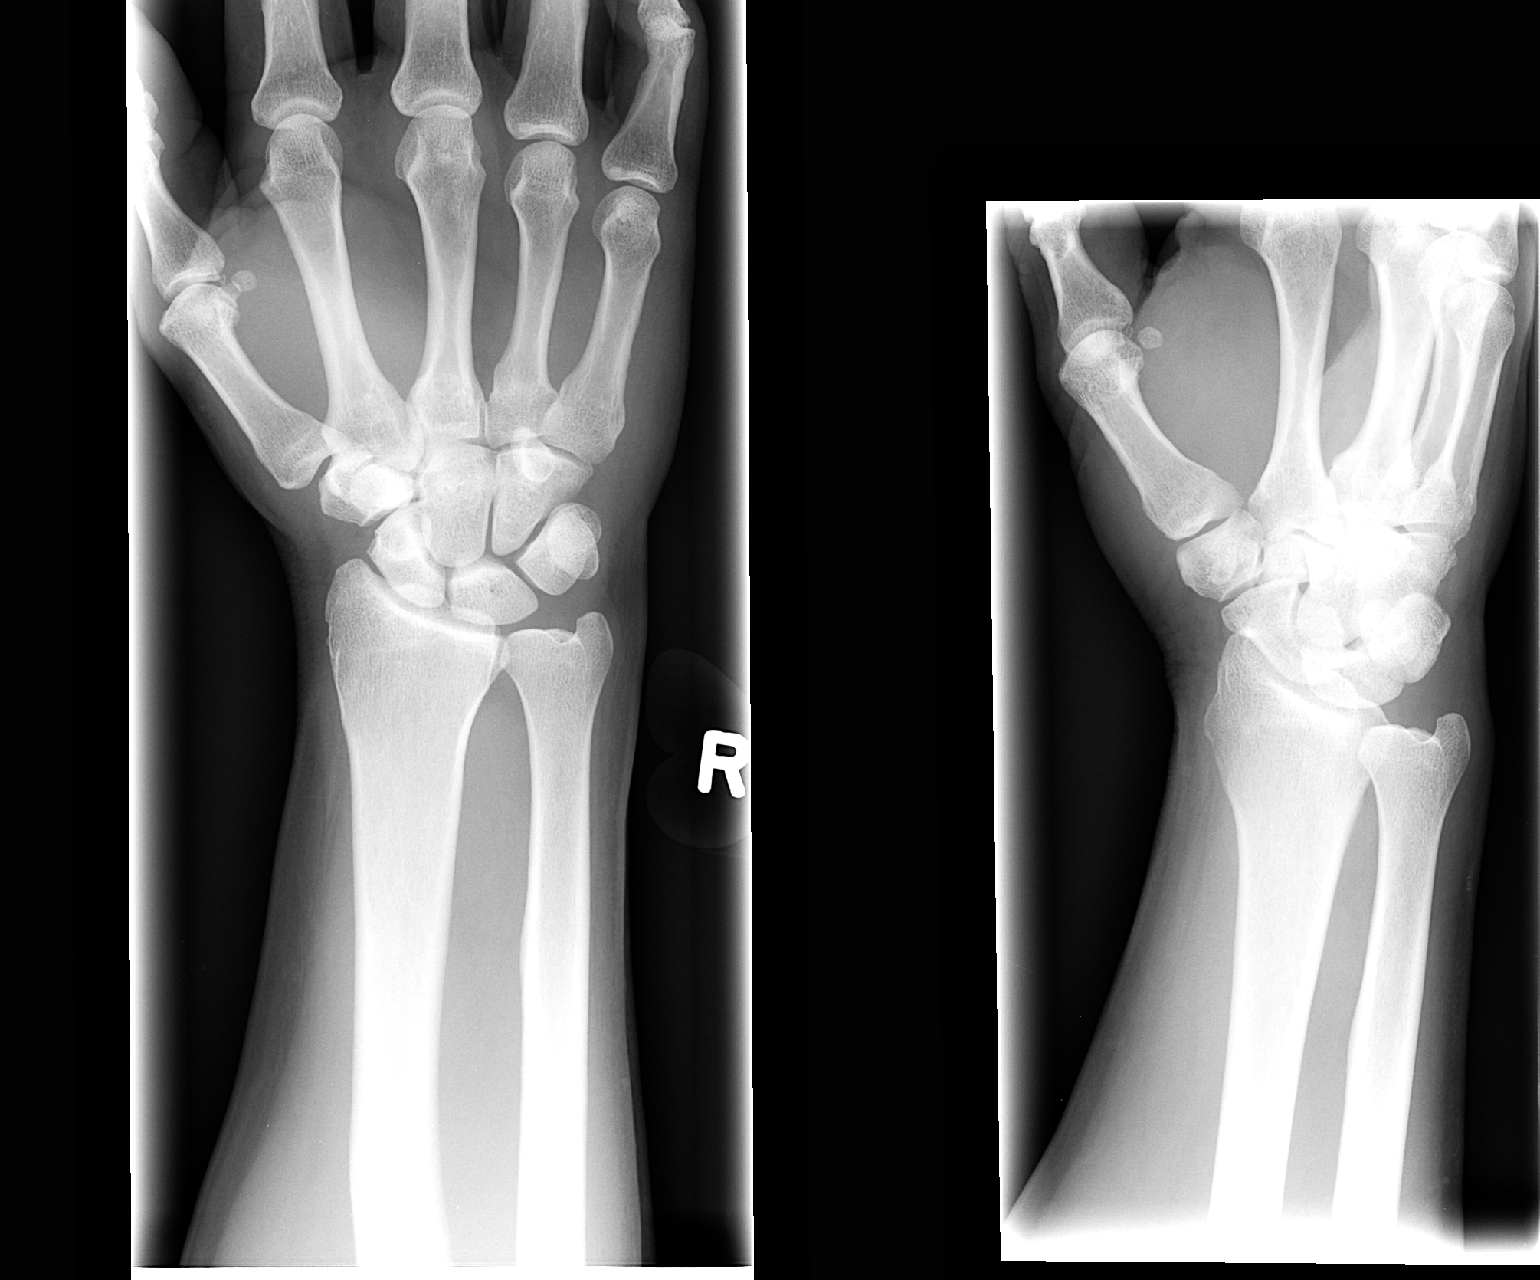

[view not recorded (2 of 2)]
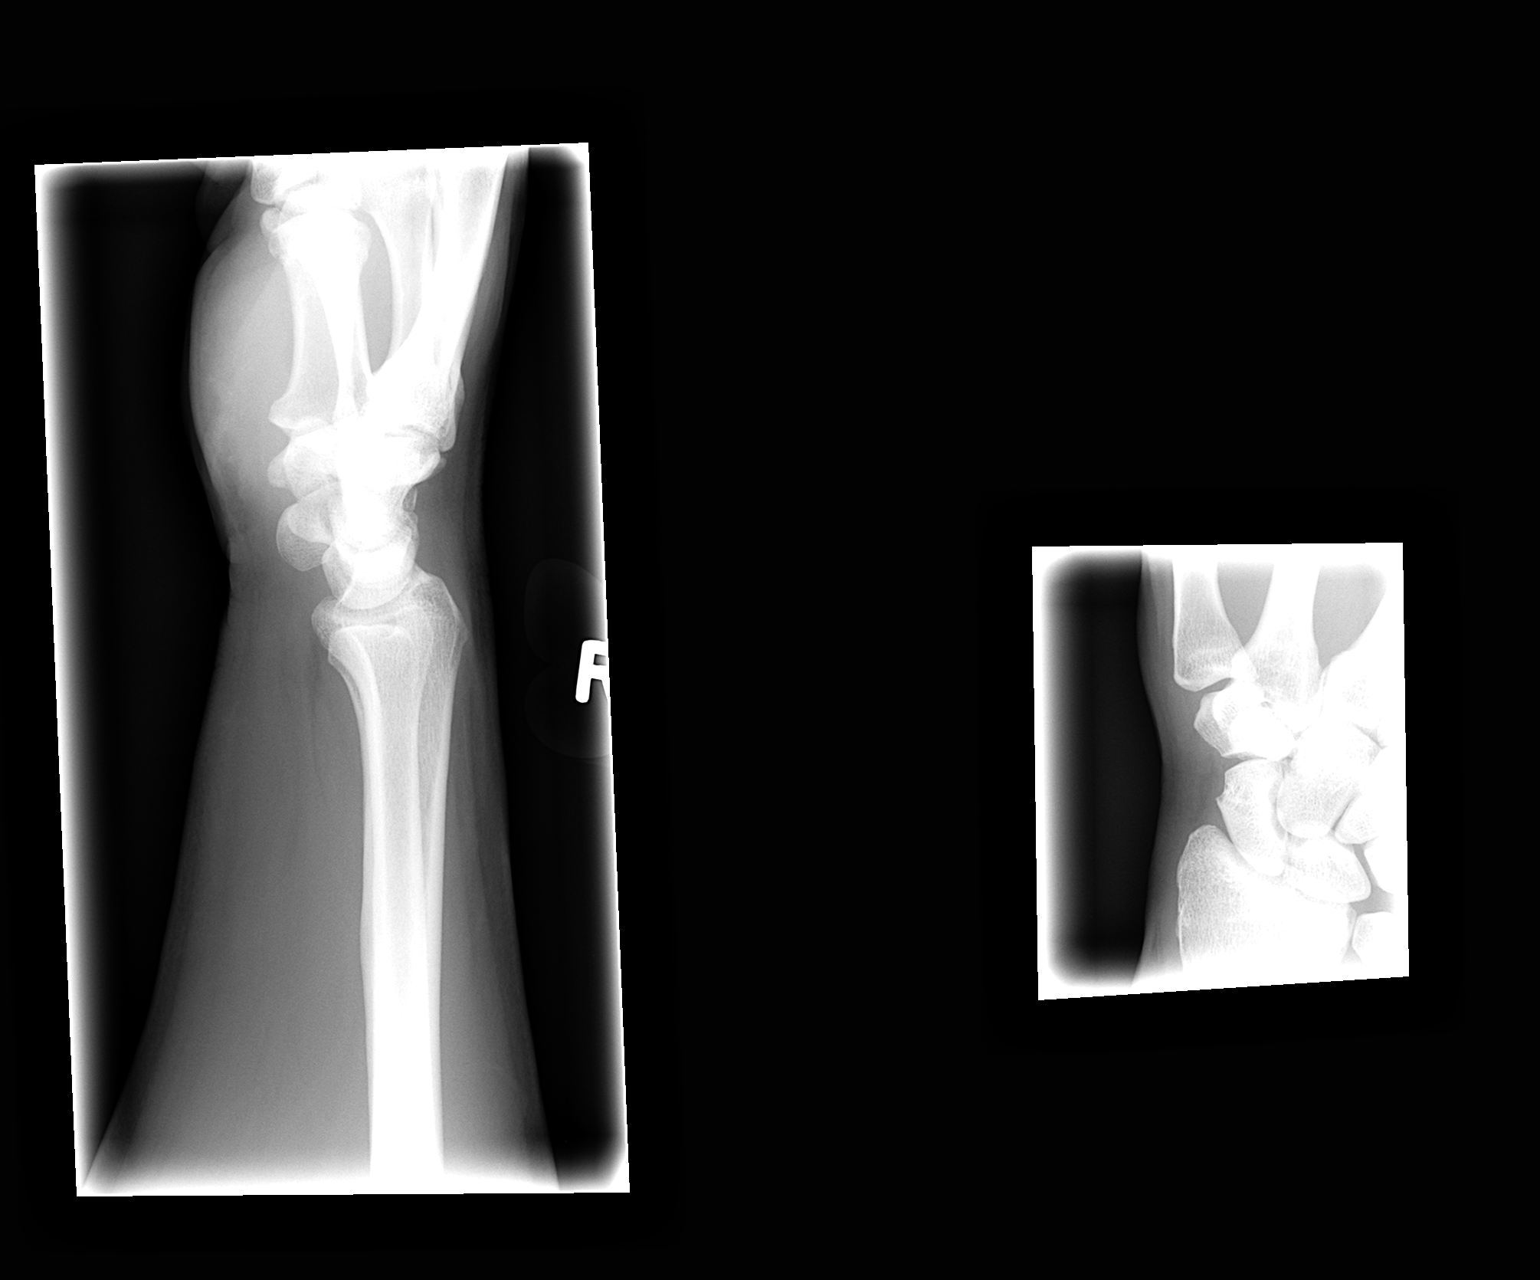

[2 of 2 positions shown; findings below may reference images not displayed]

FINDINGS: There is no evidence of fracture or dislocation.  There
is no evidence of arthropathy or other focal bony abnormality.
Soft tissues are unremarkable. No visible radiopaque foreign body.
IMPRESSION: Negative exam.

## 2012-10-10 ENCOUNTER — Other Ambulatory Visit: Payer: Self-pay | Admitting: Family Medicine

## 2012-10-10 NOTE — Telephone Encounter (Signed)
Is this okay to refill? Last OV was 03/25/12

## 2012-10-22 ENCOUNTER — Other Ambulatory Visit: Payer: Self-pay | Admitting: Family Medicine

## 2012-10-24 NOTE — Telephone Encounter (Signed)
Is this okay?

## 2012-11-14 ENCOUNTER — Other Ambulatory Visit: Payer: Self-pay | Admitting: Family Medicine

## 2012-12-21 ENCOUNTER — Other Ambulatory Visit: Payer: Self-pay | Admitting: Medical

## 2013-02-17 ENCOUNTER — Other Ambulatory Visit: Payer: Self-pay | Admitting: Family Medicine

## 2013-02-27 ENCOUNTER — Other Ambulatory Visit: Payer: Self-pay | Admitting: Family Medicine

## 2013-04-29 ENCOUNTER — Other Ambulatory Visit: Payer: Self-pay | Admitting: Family Medicine

## 2013-05-03 ENCOUNTER — Other Ambulatory Visit: Payer: Self-pay | Admitting: Family Medicine

## 2013-05-03 NOTE — Telephone Encounter (Signed)
Is this ok to refill?  

## 2013-06-26 ENCOUNTER — Telehealth: Payer: Self-pay | Admitting: Family Medicine

## 2013-06-26 ENCOUNTER — Other Ambulatory Visit: Payer: Self-pay | Admitting: Family Medicine

## 2013-06-26 NOTE — Telephone Encounter (Signed)
To let him run out but have him schedule an appointment

## 2013-06-26 NOTE — Telephone Encounter (Signed)
Are these ok to refill? 

## 2013-06-26 NOTE — Telephone Encounter (Signed)
Medication refilled, pt scheduled CPE for 08/07/13

## 2013-06-27 NOTE — Telephone Encounter (Signed)
done

## 2013-07-02 ENCOUNTER — Telehealth: Payer: Self-pay | Admitting: Family Medicine

## 2013-07-04 NOTE — Telephone Encounter (Signed)
P.A. Approved for Venlafaxine til 07/02/14, left message, faxed pharmacy

## 2013-08-07 ENCOUNTER — Encounter: Payer: Self-pay | Admitting: Family Medicine

## 2013-08-07 ENCOUNTER — Ambulatory Visit (INDEPENDENT_AMBULATORY_CARE_PROVIDER_SITE_OTHER): Payer: BC Managed Care – PPO | Admitting: Family Medicine

## 2013-08-07 VITALS — BP 130/80 | HR 60 | Ht 72.0 in | Wt 239.0 lb

## 2013-08-07 DIAGNOSIS — G4733 Obstructive sleep apnea (adult) (pediatric): Secondary | ICD-10-CM

## 2013-08-07 DIAGNOSIS — G43009 Migraine without aura, not intractable, without status migrainosus: Secondary | ICD-10-CM

## 2013-08-07 DIAGNOSIS — Z87442 Personal history of urinary calculi: Secondary | ICD-10-CM

## 2013-08-07 DIAGNOSIS — I10 Essential (primary) hypertension: Secondary | ICD-10-CM

## 2013-08-07 DIAGNOSIS — E669 Obesity, unspecified: Secondary | ICD-10-CM

## 2013-08-07 DIAGNOSIS — B351 Tinea unguium: Secondary | ICD-10-CM

## 2013-08-07 DIAGNOSIS — E039 Hypothyroidism, unspecified: Secondary | ICD-10-CM

## 2013-08-07 DIAGNOSIS — F3289 Other specified depressive episodes: Secondary | ICD-10-CM

## 2013-08-07 DIAGNOSIS — M459 Ankylosing spondylitis of unspecified sites in spine: Secondary | ICD-10-CM

## 2013-08-07 DIAGNOSIS — Z0000 Encounter for general adult medical examination without abnormal findings: Secondary | ICD-10-CM

## 2013-08-07 DIAGNOSIS — F329 Major depressive disorder, single episode, unspecified: Secondary | ICD-10-CM

## 2013-08-07 LAB — POCT URINALYSIS DIPSTICK
Bilirubin, UA: NEGATIVE
Glucose, UA: NEGATIVE
KETONES UA: NEGATIVE
Leukocytes, UA: NEGATIVE
Nitrite, UA: NEGATIVE
PH UA: 6
RBC UA: NEGATIVE
SPEC GRAV UA: 1.015
UROBILINOGEN UA: NEGATIVE

## 2013-08-07 LAB — COMPREHENSIVE METABOLIC PANEL
ALT: 27 U/L (ref 0–53)
AST: 24 U/L (ref 0–37)
Albumin: 4.3 g/dL (ref 3.5–5.2)
Alkaline Phosphatase: 65 U/L (ref 39–117)
BUN: 18 mg/dL (ref 6–23)
CALCIUM: 9.5 mg/dL (ref 8.4–10.5)
CHLORIDE: 100 meq/L (ref 96–112)
CO2: 31 meq/L (ref 19–32)
CREATININE: 1.2 mg/dL (ref 0.50–1.35)
Glucose, Bld: 86 mg/dL (ref 70–99)
Potassium: 4.4 mEq/L (ref 3.5–5.3)
Sodium: 138 mEq/L (ref 135–145)
Total Bilirubin: 0.5 mg/dL (ref 0.2–1.2)
Total Protein: 7 g/dL (ref 6.0–8.3)

## 2013-08-07 LAB — CBC WITH DIFFERENTIAL/PLATELET
BASOS ABS: 0.1 10*3/uL (ref 0.0–0.1)
Basophils Relative: 2 % — ABNORMAL HIGH (ref 0–1)
EOS ABS: 0.1 10*3/uL (ref 0.0–0.7)
EOS PCT: 3 % (ref 0–5)
HCT: 44.3 % (ref 39.0–52.0)
Hemoglobin: 15.7 g/dL (ref 13.0–17.0)
Lymphocytes Relative: 30 % (ref 12–46)
Lymphs Abs: 1.4 10*3/uL (ref 0.7–4.0)
MCH: 33.8 pg (ref 26.0–34.0)
MCHC: 35.4 g/dL (ref 30.0–36.0)
MCV: 95.5 fL (ref 78.0–100.0)
Monocytes Absolute: 0.7 10*3/uL (ref 0.1–1.0)
Monocytes Relative: 15 % — ABNORMAL HIGH (ref 3–12)
Neutro Abs: 2.3 10*3/uL (ref 1.7–7.7)
Neutrophils Relative %: 50 % (ref 43–77)
PLATELETS: 263 10*3/uL (ref 150–400)
RBC: 4.64 MIL/uL (ref 4.22–5.81)
RDW: 14.1 % (ref 11.5–15.5)
WBC: 4.5 10*3/uL (ref 4.0–10.5)

## 2013-08-07 LAB — LIPID PANEL
CHOLESTEROL: 224 mg/dL — AB (ref 0–200)
HDL: 54 mg/dL (ref >39)
LDL Cholesterol: 153 mg/dL — ABNORMAL HIGH (ref 0–99)
Total CHOL/HDL Ratio: 4.1 Ratio
Triglycerides: 86 mg/dL (ref <150)
VLDL: 17 mg/dL (ref 0–40)

## 2013-08-07 LAB — TSH: TSH: 1.627 u[IU]/mL (ref 0.350–4.500)

## 2013-08-07 MED ORDER — LEVOTHYROXINE SODIUM 100 MCG PO TABS: ORAL_TABLET | ORAL | Status: DC

## 2013-08-07 MED ORDER — VENLAFAXINE HCL 75 MG PO TABS: ORAL_TABLET | ORAL | Status: DC

## 2013-08-07 MED ORDER — INDOMETHACIN 50 MG PO CAPS: ORAL_CAPSULE | ORAL | Status: DC

## 2013-08-07 MED ORDER — AMLODIPINE BESYLATE 10 MG PO TABS: ORAL_TABLET | ORAL | Status: DC

## 2013-08-07 MED ORDER — LOSARTAN POTASSIUM-HCTZ 100-12.5 MG PO TABS
ORAL_TABLET | ORAL | Status: DC
Start: 2013-08-07 — End: 2014-08-12

## 2013-08-07 NOTE — Progress Notes (Signed)
Subjective:    Patient ID: [name] [name], male    DOB: Jun 23, 1950, 62 y.o.   MRN: [id number]  HPI He is here for complete examination. He mainly has concerns over onychomycosis. He had this treated in the past however did not completely eradicate. He was seen in the recent past by dermatology and placed on a different regimen however it has been minimally successful. He continues to do quite nicely on Effexor and wishes to stay on this. He does have a remote history of sleep apnea but states he did not do well on the CPAP and has no sleep related complaints or issues of fatigue during the day. He does have ankylosing spondylitis which is giving him only minimal difficulty. He does well on Indocin. He rarely has migraine headaches and has not had a renal stone in quite some time. His social and family history are unchanged. He has no other concerns or complaints. 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   Review of Systems  All other systems reviewed and are negative.      Objective:   Physical Exam BP 130/80  Pulse 60  Ht 6' (1.829 m)  Wt 239 lb (108.41 kg)  BMI 32.41 kg/m2  General Appearance:    Alert, cooperative, no distress, appears stated age  Head:    Normocephalic, without obvious abnormality, atraumatic  Eyes:    PERRL, conjunctiva/corneas clear, EOM's intact, fundi    benign  Ears:    Normal TM's and external ear canals  Nose:   Nares normal, mucosa normal, no drainage or sinus   tenderness  Throat:   Lips, mucosa, and tongue normal; teeth and gums normal  Neck:   Supple, no lymphadenopathy;  thyroid:  no   enlargement/tenderness/nodules; no carotid   bruit or JVD  Back:    Spine nontender, no curvature, ROM normal, no CVA     tenderness  Lungs:     Clear to auscultation bilaterally without wheezes, rales or     ronchi; respirations unlabored  Chest Wall:    No tenderness or deformity   Heart:    Regular rate and rhythm, S1 and S2  normal, no murmur, rub   or gallop  Breast Exam:    No chest wall tenderness, masses or gynecomastia  Abdomen:     Soft, non-tender, nondistended, normoactive bowel sounds,    no masses, no hepatosplenomegaly        Extremities:   No clubbing, cyanosis or edema and thickening of the toenails is noted bilaterally.   Pulses:   2+ and symmetric all extremities  Skin:   Skin color, texture, turgor normal, no rashes or lesions  Lymph nodes:   Cervical, supraclavicular, and axillary nodes normal  Neurologic:   CNII-XII intact, normal strength, sensation and gait; reflexes 2+ and symmetric throughout          Psych:   Normal mood, affect, hygiene and grooming.          Assessment & Plan:  Routine general medical examination at a health care facility - Plan: Urinalysis Dipstick, CBC with Differential, Comprehensive metabolic panel, Lipid panel  Onychomycosis  DEPRESSION - Plan: venlafaxine (EFFEXOR) 75 MG tablet  OBSTRUCTIVE SLEEP APNEA  HYPERTENSION - Plan: CBC with Differential, Comprehensive metabolic panel, losartan-hydrochlorothiazide (HYZAAR) 100-12.5 MG per tablet, amLODipine (NORVASC) 10 MG tablet  SPONDYLITIS, ANKYLOSING - Plan: indomethacin (INDOCIN) 50 MG capsule  Hypothyroidism, unspecified hypothyroidism type - Plan: TSH, levothyroxine (  SYNTHROID, LEVOTHROID) 100 MCG tablet  RENAL CALCULUS, HX OF  Obesity (BMI 30.0-34.9) - Plan: CBC with Differential, Comprehensive metabolic panel, Lipid panel  Migraine without aura and without status migrainosus, not intractable  he will continue on his present medication regimen. Routine blood screening was also done. His medications were renewed. I discussed treatment of his toenails and at this time he will defer any medication management. He also discussed his weight. At this time he has no particular plans to make any major changes.

## 2013-10-09 ENCOUNTER — Encounter: Payer: Self-pay | Admitting: Family Medicine

## 2013-10-09 ENCOUNTER — Ambulatory Visit (INDEPENDENT_AMBULATORY_CARE_PROVIDER_SITE_OTHER): Payer: BC Managed Care – PPO | Admitting: Family Medicine

## 2013-10-09 VITALS — BP 138/82 | HR 76 | Wt 229.0 lb

## 2013-10-09 DIAGNOSIS — Z23 Encounter for immunization: Secondary | ICD-10-CM

## 2013-10-09 DIAGNOSIS — M542 Cervicalgia: Secondary | ICD-10-CM

## 2013-10-09 DIAGNOSIS — H5711 Ocular pain, right eye: Secondary | ICD-10-CM

## 2013-10-09 DIAGNOSIS — M7710 Lateral epicondylitis, unspecified elbow: Secondary | ICD-10-CM

## 2013-10-09 DIAGNOSIS — H5710 Ocular pain, unspecified eye: Secondary | ICD-10-CM

## 2013-10-09 DIAGNOSIS — M7711 Lateral epicondylitis, right elbow: Secondary | ICD-10-CM

## 2013-10-09 NOTE — Progress Notes (Signed)
   Subjective:    Patient ID: [name] [name], male    DOB: 03-24-1950, 63 y.o.   MRN: [id number]  HPI He is here for multiple issues. He does note a black spot in the lateral aspect of his right eye in the visual field. It does move when he moves his eye. He has no associated pain with this. He does not note this on the other side. 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 [place]. He also complains of some left upper chest discomfort but no neck or shoulder pain no numbness tingling or weakness. No chest pain shortness of breath.   Review of Systems     Objective:   Physical Exam Alert and in no distress. Full motion of the neck without pain. No palpable tenderness to the neck or upper left chest area. Good motion of the left shoulder. Exam of the right elbow does show tenderness palpation over the lateral epicondyle. Full motion of the elbow.       Assessment & Plan:  Eye discomfort, right - Plan: Ambulatory referral to Ophthalmology  Neck pain on left side  Lateral epicondylitis, right  Need for prophylactic vaccination and inoculation against influenza - Plan: Flu Vaccine QUAD 36+ mos IM  I will refer to ophthalmology for his eye but explained that this is probably a normal variant. No therapy for the neck pain. Discussed proper treatment of the lateral epicondylitis in regard to doing as many things pounds up and open. Flu shot given.

## 2013-10-10 ENCOUNTER — Encounter: Payer: Self-pay | Admitting: Gastroenterology

## 2013-10-10 ENCOUNTER — Telehealth: Payer: Self-pay | Admitting: Internal Medicine

## 2013-10-10 NOTE — Telephone Encounter (Signed)
I have called pt to left him a VM letting him know he has an appt with [facility] on Friday 8/28 @ 11am with Dr. Melissa Noon. If he can not make it he can call and reschedule @ [phone]

## 2014-01-15 ENCOUNTER — Ambulatory Visit (INDEPENDENT_AMBULATORY_CARE_PROVIDER_SITE_OTHER): Payer: BC Managed Care – PPO | Admitting: Family Medicine

## 2014-01-15 ENCOUNTER — Encounter: Payer: Self-pay | Admitting: Family Medicine

## 2014-01-15 VITALS — BP 126/90 | HR 85 | Wt 233.0 lb

## 2014-01-15 DIAGNOSIS — F32A Depression, unspecified: Secondary | ICD-10-CM

## 2014-01-15 DIAGNOSIS — E039 Hypothyroidism, unspecified: Secondary | ICD-10-CM

## 2014-01-15 DIAGNOSIS — M459 Ankylosing spondylitis of unspecified sites in spine: Secondary | ICD-10-CM

## 2014-01-15 DIAGNOSIS — F329 Major depressive disorder, single episode, unspecified: Secondary | ICD-10-CM

## 2014-01-15 NOTE — Progress Notes (Signed)
   Subjective:    Patient ID: [name] [name], male    DOB: 09-05-1950, 63 y.o.   MRN: [id number]  HPI He is here for consultation. He does have underlying ankylosing spondylitis and does use Indocin fairly regularly with good relief. He does have some stiffness and pain but is able to accomplish his ADLs with little interference. He worked [employer] over the weekend. He plays golf regularly. He does have underlying depression as well as hypothyroidism and hypertension. 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 Apparently it was not a good fit and the contract was terminated early. He admits to being burnt out concerning this. He is interested in trying to get disability due to his underlying medical conditions.   Review of Systems     Objective:   Physical Exam Alert and in no distress with appropriate affect and dressed appropriately.       Assessment & Plan:  SPONDYLITIS, ANKYLOSING  Hypothyroidism, unspecified hypothyroidism type  Depression  I discussed the fact that none of his medical problems seem to be interfering with his ability to function specifically his ankylosing spondylitis, thyroid and hypertension. It seems that he is having more difficulty psychologically and feeling burned out. Recommended that if he wanted to pursue this further psychiatric consultation would be needed as I do not fill qualified to make that decision.

## 2014-01-26 ENCOUNTER — Telehealth: Payer: Self-pay | Admitting: Internal Medicine

## 2014-01-26 MED ORDER — CIPROFLOXACIN HCL 500 MG PO TABS: 500.0000 mg | ORAL_TABLET | Freq: Two times a day (BID) | ORAL | Status: DC

## 2014-01-26 NOTE — Telephone Encounter (Signed)
Pt is leaving for [place] on Tuesday and was recommended by the [facility] advisory that he needs a rx for cipro incase he has any diarrhea. [facility]

## 2014-05-30 ENCOUNTER — Other Ambulatory Visit: Payer: Self-pay | Admitting: Family Medicine

## 2014-05-30 NOTE — Telephone Encounter (Signed)
Is this okay to refill? 

## 2014-08-03 ENCOUNTER — Other Ambulatory Visit: Payer: Self-pay | Admitting: Family Medicine

## 2014-08-12 ENCOUNTER — Other Ambulatory Visit: Payer: Self-pay | Admitting: Family Medicine

## 2014-08-17 ENCOUNTER — Other Ambulatory Visit: Payer: Self-pay | Admitting: Family Medicine

## 2014-08-17 ENCOUNTER — Telehealth: Payer: Self-pay | Admitting: Family Medicine

## 2014-08-17 NOTE — Telephone Encounter (Signed)
Refills sent

## 2014-08-17 NOTE — Telephone Encounter (Signed)
Pt has an appt on 07/13 needs refills before then. He only has a few pills left. Please fill levothyroxine, losartan and Norvasc. [facility].

## 2014-08-29 ENCOUNTER — Ambulatory Visit (INDEPENDENT_AMBULATORY_CARE_PROVIDER_SITE_OTHER): Payer: BLUE CROSS/BLUE SHIELD | Admitting: Family Medicine

## 2014-08-29 ENCOUNTER — Encounter: Payer: Self-pay | Admitting: Family Medicine

## 2014-08-29 VITALS — BP 120/82 | HR 68 | Ht 72.0 in | Wt 229.0 lb

## 2014-08-29 DIAGNOSIS — G43009 Migraine without aura, not intractable, without status migrainosus: Secondary | ICD-10-CM

## 2014-08-29 DIAGNOSIS — Z0000 Encounter for general adult medical examination without abnormal findings: Secondary | ICD-10-CM

## 2014-08-29 DIAGNOSIS — M459 Ankylosing spondylitis of unspecified sites in spine: Secondary | ICD-10-CM | POA: Diagnosis not present

## 2014-08-29 DIAGNOSIS — I10 Essential (primary) hypertension: Secondary | ICD-10-CM

## 2014-08-29 DIAGNOSIS — K589 Irritable bowel syndrome without diarrhea: Secondary | ICD-10-CM

## 2014-08-29 DIAGNOSIS — Z87442 Personal history of urinary calculi: Secondary | ICD-10-CM | POA: Diagnosis not present

## 2014-08-29 DIAGNOSIS — F329 Major depressive disorder, single episode, unspecified: Secondary | ICD-10-CM

## 2014-08-29 DIAGNOSIS — G4733 Obstructive sleep apnea (adult) (pediatric): Secondary | ICD-10-CM

## 2014-08-29 DIAGNOSIS — E038 Other specified hypothyroidism: Secondary | ICD-10-CM | POA: Diagnosis not present

## 2014-08-29 DIAGNOSIS — F32A Depression, unspecified: Secondary | ICD-10-CM

## 2014-08-29 LAB — COMPREHENSIVE METABOLIC PANEL
ALBUMIN: 4.1 g/dL (ref 3.5–5.2)
ALT: 19 U/L (ref 0–53)
AST: 21 U/L (ref 0–37)
Alkaline Phosphatase: 60 U/L (ref 39–117)
BUN: 22 mg/dL (ref 6–23)
CHLORIDE: 100 meq/L (ref 96–112)
CO2: 29 mEq/L (ref 19–32)
CREATININE: 1.14 mg/dL (ref 0.50–1.35)
Calcium: 9.2 mg/dL (ref 8.4–10.5)
Glucose, Bld: 93 mg/dL (ref 70–99)
POTASSIUM: 4.4 meq/L (ref 3.5–5.3)
Sodium: 138 mEq/L (ref 135–145)
Total Bilirubin: 0.8 mg/dL (ref 0.2–1.2)
Total Protein: 7 g/dL (ref 6.0–8.3)

## 2014-08-29 LAB — LIPID PANEL
Cholesterol: 211 mg/dL — ABNORMAL HIGH (ref 0–200)
HDL: 64 mg/dL (ref >=40)
LDL Cholesterol: 130 mg/dL — ABNORMAL HIGH (ref 0–99)
Total CHOL/HDL Ratio: 3.3 Ratio
Triglycerides: 84 mg/dL (ref <150)
VLDL: 17 mg/dL (ref 0–40)

## 2014-08-29 LAB — CBC WITH DIFFERENTIAL/PLATELET
Basophils Absolute: 0.1 10*3/uL (ref 0.0–0.1)
Basophils Relative: 1 % (ref 0–1)
EOS ABS: 0.2 10*3/uL (ref 0.0–0.7)
EOS PCT: 4 % (ref 0–5)
HCT: 46.5 % (ref 39.0–52.0)
Hemoglobin: 16.3 g/dL (ref 13.0–17.0)
LYMPHS ABS: 1.5 10*3/uL (ref 0.7–4.0)
LYMPHS PCT: 29 % (ref 12–46)
MCH: 34.7 pg — AB (ref 26.0–34.0)
MCHC: 35.1 g/dL (ref 30.0–36.0)
MCV: 98.9 fL (ref 78.0–100.0)
MONOS PCT: 9 % (ref 3–12)
MPV: 8.8 fL (ref 8.6–12.4)
Monocytes Absolute: 0.5 10*3/uL (ref 0.1–1.0)
Neutro Abs: 2.9 10*3/uL (ref 1.7–7.7)
Neutrophils Relative %: 57 % (ref 43–77)
Platelets: 263 10*3/uL (ref 150–400)
RBC: 4.7 MIL/uL (ref 4.22–5.81)
RDW: 13.9 % (ref 11.5–15.5)
WBC: 5 10*3/uL (ref 4.0–10.5)

## 2014-08-29 LAB — POCT URINALYSIS DIPSTICK
Bilirubin, UA: NEGATIVE
Blood, UA: NEGATIVE
GLUCOSE UA: NEGATIVE
KETONES UA: NEGATIVE
LEUKOCYTES UA: NEGATIVE
NITRITE UA: NEGATIVE
PH UA: 5.5
PROTEIN UA: NEGATIVE
Spec Grav, UA: >=1.03
Urobilinogen, UA: NEGATIVE

## 2014-08-29 LAB — TSH: TSH: 1.434 u[IU]/mL (ref 0.350–4.500)

## 2014-08-29 MED ORDER — AMLODIPINE BESYLATE 5 MG PO TABS: 5.0000 mg | ORAL_TABLET | Freq: Every day | ORAL | Status: DC

## 2014-08-29 NOTE — Patient Instructions (Signed)
Let's stop the Effexor and keep in touch on both your mood and headaches. Use the indomethacin with a slight headache

## 2014-08-29 NOTE — Progress Notes (Signed)
Subjective:    Patient ID: [name] [name], male    DOB: [birth year], 64 y.o.   MRN: [id number]  HPI 's here for complete examination. He is now semi retired and his stress levels have been greatly reduced. He has cut back on his Effexor to a total 75 mg per day and states psychologically he is doing well. He also has not had any migraine in approximately 2 years. He also cut back on his amlodipine to 5 mg. He does have OSA but is not on CPAP and states that he is doing nicely having very low difficulty with fatigue. There is a history of irritable bowel however in the past there is also question of Crohn's disease however he is having no abdominal pain, nausea, vomiting, diarrhea. He continues on thyroid and is having no difficulty with that. No history of recent renal stones.Family and social history as well as health maintenance and immunizations were reviewed.   Review of Systems  All other systems reviewed and are negative.      Objective:   Physical Exam BP 120/82 mmHg  Pulse 68  Ht 6' (1.829 m)  Wt 229 lb (103.874 kg)  BMI 31.05 kg/m2  SpO2 95%  General Appearance:    Alert, cooperative, no distress, appears stated age  Head:    Normocephalic, without obvious abnormality, atraumatic  Eyes:    PERRL, conjunctiva/corneas clear, EOM's intact, fundi    benign  Ears:    Normal TM's and external ear canals  Nose:   Nares normal, mucosa normal, no drainage or sinus   tenderness  Throat:   Lips, mucosa, and tongue normal; teeth and gums normal  Neck:   Supple, no lymphadenopathy;  thyroid:  no   enlargement/tenderness/nodules; no carotid   bruit or JVD  Back:    Spine nontender, no curvature, ROM normal, no CVA     tenderness  Lungs:     Clear to auscultation bilaterally without wheezes, rales or     ronchi; respirations unlabored  Chest Wall:    No tenderness or deformity   Heart:    Regular rate and rhythm, S1 and S2 normal, no murmur, rub   or gallop  Breast Exam:    No  chest wall tenderness, masses or gynecomastia  Abdomen:     Soft, non-tender, nondistended, normoactive bowel sounds,    no masses, no hepatosplenomegaly        Extremities:   No clubbing, cyanosis or edema  Pulses:   2+ and symmetric all extremities  Skin:   Skin color, texture, turgor normal, no rashes or lesions  Lymph nodes:   Cervical, supraclavicular, and axillary nodes normal  Neurologic:   CNII-XII intact, normal strength, sensation and gait; reflexes 2+ and symmetric throughout          Psych:   Normal mood, affect, hygiene and grooming.          Assessment & Plan:  Routine general medical examination at a health care facility - Plan: POCT Urinalysis Dipstick, CBC with Differential/Platelet, Comprehensive metabolic panel, Lipid panel, amLODipine (NORVASC) 5 MG tablet  Depression  Essential hypertension - Plan: CBC with Differential/Platelet, Comprehensive metabolic panel, amLODipine (NORVASC) 5 MG tablet  Obstructive sleep apnea  Irritable bowel syndrome  SPONDYLITIS, ANKYLOSING  Migraine without aura and without status migrainosus, not intractable  Other specified hypothyroidism - Plan: TSH  RENAL CALCULUS, HX OF I will have him stop his Effexor and see how he is doing psychologically as well  as with his headaches. We will continue to monitor his blood pressure. Continue other medications and encouraged him to stay physically active. He seems to be doing quite nicely in his semi retirement.

## 2014-08-30 MED ORDER — LOSARTAN POTASSIUM-HCTZ 100-12.5 MG PO TABS: 1.0000 | ORAL_TABLET | Freq: Every day | ORAL | Status: DC

## 2014-08-30 MED ORDER — INDOMETHACIN 50 MG PO CAPS
ORAL_CAPSULE | ORAL | Status: DC
Start: 2014-08-30 — End: 2015-08-14

## 2014-08-30 MED ORDER — LEVOTHYROXINE SODIUM 100 MCG PO TABS: ORAL_TABLET | ORAL | Status: DC

## 2014-08-30 NOTE — Addendum Note (Signed)
Addended by: Denita Lung on: 08/30/2014 11:20 AM   Modules accepted: Orders

## 2014-09-07 ENCOUNTER — Other Ambulatory Visit: Payer: Self-pay | Admitting: Family Medicine

## 2014-09-07 ENCOUNTER — Other Ambulatory Visit: Payer: Self-pay

## 2014-09-07 NOTE — Telephone Encounter (Signed)
Is this okay?

## 2014-09-07 NOTE — Telephone Encounter (Signed)
Okay to call in. 

## 2014-09-17 ENCOUNTER — Other Ambulatory Visit: Payer: Self-pay

## 2014-09-17 ENCOUNTER — Telehealth: Payer: Self-pay | Admitting: Family Medicine

## 2014-09-17 ENCOUNTER — Other Ambulatory Visit: Payer: Self-pay | Admitting: Family Medicine

## 2014-09-17 NOTE — Telephone Encounter (Signed)
Is this okay?

## 2014-09-17 NOTE — Telephone Encounter (Signed)
Call in the soma

## 2014-09-17 NOTE — Telephone Encounter (Signed)
Pt states Manuela Neptune is helping & wants another refill to [facility] for his back

## 2014-10-15 ENCOUNTER — Other Ambulatory Visit: Payer: Self-pay | Admitting: Family Medicine

## 2014-10-15 NOTE — Telephone Encounter (Signed)
Is this okay?

## 2014-11-19 ENCOUNTER — Other Ambulatory Visit: Payer: Self-pay | Admitting: Family Medicine

## 2014-11-26 ENCOUNTER — Ambulatory Visit (INDEPENDENT_AMBULATORY_CARE_PROVIDER_SITE_OTHER): Payer: BLUE CROSS/BLUE SHIELD | Admitting: Family Medicine

## 2014-11-26 ENCOUNTER — Encounter: Payer: Self-pay | Admitting: Family Medicine

## 2014-11-26 VITALS — BP 124/86 | HR 68 | Temp 98.4°F | Wt 234.8 lb

## 2014-11-26 DIAGNOSIS — R590 Localized enlarged lymph nodes: Secondary | ICD-10-CM | POA: Diagnosis not present

## 2014-11-26 DIAGNOSIS — Z23 Encounter for immunization: Secondary | ICD-10-CM | POA: Diagnosis not present

## 2014-11-26 NOTE — Progress Notes (Signed)
   Subjective:    Patient ID: [name] [name], male    DOB: 11/29/1950, 64 y.o.   MRN: [id number]  HPI He is here for an acute onset of left sided swollen cervical lymph nodes for past 5 days. He reports he felt slightly feverish and like he might be getting sick last week before he noticed the swollen nodes. He states he felt better then next day and illness did not develop however, left sided lymphadenopathy persists. Denies fever, chills, unexplained weight loss, fatigue, sinus pressure, drainage, cough, ear pain, mouth pain, difficulty swallowing, abdominal pain, back pain or GI upset. He does not smoke.   Reviewed allergies, medications, past medical and social history.     Review of Systems Pertinent positives and negatives in the history of present illness.    Objective:   Physical Exam  Constitutional: He is oriented to person, place, and time. He appears well-developed and well-nourished. No distress.  HENT:  Right Ear: Tympanic membrane and ear canal normal.  Left Ear: Tympanic membrane and ear canal normal.  Nose: Nose normal.  Mouth/Throat: Uvula is midline, oropharynx is clear and moist and mucous membranes are normal. No oral lesions. No posterior oropharyngeal edema or posterior oropharyngeal erythema.  Neck: Normal range of motion and full passive range of motion without pain. Neck supple. Erythema present.    Lymphadenopathy:       Head (right side): No submental and no occipital adenopathy present.       Head (left side): No occipital adenopathy present.    He has cervical adenopathy.       Left cervical: No superficial cervical adenopathy present.    He has no axillary adenopathy.       Right: No inguinal and no supraclavicular adenopathy present.       Left: No inguinal and no supraclavicular adenopathy present.  Neurological: He is alert and oriented to person, place, and time.  Skin: Skin is warm and dry. No rash noted. No cyanosis.   BP 124/86 mmHg  Pulse  68  Temp(Src) 98.4 F (36.9 C) (Oral)  Wt 234 lb 12.8 oz (106.505 kg)       Assessment & Plan:  Cervical adenopathy  Needs flu shot - Plan: Flu Vaccine QUAD 36+ mos IM  Discussed patient with Dr. Tomi [name] and agree that symptoms are most likely related to viral etiology. Discussed with patient that we will do watchful waiting and that it may take up to 3-4 weeks for lymph nodes to return to normal. Recommend that he let me know if he develops fever or other symptoms. Also recommend that he let me know if he notices increased lymphadenopathy or any other areas of lymph node enlargement. Flu shot given

## 2015-02-23 ENCOUNTER — Other Ambulatory Visit: Payer: Self-pay | Admitting: Family Medicine

## 2015-03-01 ENCOUNTER — Other Ambulatory Visit: Payer: Self-pay | Admitting: Family Medicine

## 2015-05-10 ENCOUNTER — Other Ambulatory Visit: Payer: Self-pay | Admitting: Family Medicine

## 2015-05-13 ENCOUNTER — Ambulatory Visit (INDEPENDENT_AMBULATORY_CARE_PROVIDER_SITE_OTHER): Payer: BLUE CROSS/BLUE SHIELD | Admitting: Family Medicine

## 2015-05-13 ENCOUNTER — Encounter: Payer: Self-pay | Admitting: Family Medicine

## 2015-05-13 VITALS — BP 140/92 | HR 74 | Ht 72.0 in | Wt 233.0 lb

## 2015-05-13 DIAGNOSIS — N200 Calculus of kidney: Secondary | ICD-10-CM

## 2015-05-13 MED ORDER — TAMSULOSIN HCL 0.4 MG PO CAPS: 0.4000 mg | ORAL_CAPSULE | Freq: Every day | ORAL | Status: DC

## 2015-05-13 MED ORDER — HYDROCODONE-ACETAMINOPHEN 5-325 MG PO TABS: ORAL_TABLET | ORAL | Status: DC

## 2015-05-13 NOTE — Progress Notes (Signed)
   Subjective:    Patient ID: [name] [name], male    DOB: Aug 19, 1950, 65 y.o.   MRN: [id number]  HPI Is here for consult concerning a renal stone. He has a previous history of difficulty with stone and noted the onset of pain approximately 1 week ago that did go away. On Saturday he noted some urinary frequency as well as some nausea and diaphoresis followed by excruciating pain. He treated this with OTC medications. He states the stone passed and he did catch it in a strainer but did not bring it with him. He has a trip planned to the [facility] and will be there grafting for proximally 16 days. He also had a tick bite to the right neck approximately 2 weeks ago but now is having no difficulty with fever, headache, rash.   Review of Systems     Objective:   Physical Exam Alert and in no distress otherwise not examined       Assessment & Plan:  Renal stone - Plan: tamsulosin (FLOMAX) 0.4 MG CAPS capsule, HYDROcodone-acetaminophen (NORCO/VICODIN) 5-325 MG tablet discussed use of Flomax as well as codeine and keep himself well-hydrated. Hopefully he will not have difficulty on his trip. Also discussed tick bite and since he is down about 2 weeks and having no symptoms I reassured him that I did not think he was in any danger.

## 2015-07-21 ENCOUNTER — Other Ambulatory Visit: Payer: Self-pay | Admitting: Family Medicine

## 2015-07-22 NOTE — Telephone Encounter (Signed)
Is this okay to refill? 

## 2015-08-14 ENCOUNTER — Other Ambulatory Visit: Payer: Self-pay | Admitting: Family Medicine

## 2015-08-31 ENCOUNTER — Other Ambulatory Visit: Payer: Self-pay | Admitting: Family Medicine

## 2015-09-28 ENCOUNTER — Other Ambulatory Visit: Payer: Self-pay | Admitting: Family Medicine

## 2015-09-28 DIAGNOSIS — Z0000 Encounter for general adult medical examination without abnormal findings: Secondary | ICD-10-CM

## 2015-09-28 DIAGNOSIS — I10 Essential (primary) hypertension: Secondary | ICD-10-CM

## 2015-11-13 ENCOUNTER — Other Ambulatory Visit: Payer: Self-pay | Admitting: Family Medicine

## 2015-12-06 ENCOUNTER — Other Ambulatory Visit: Payer: Self-pay | Admitting: Family Medicine

## 2015-12-11 ENCOUNTER — Encounter: Payer: Self-pay | Admitting: Family Medicine

## 2015-12-11 ENCOUNTER — Ambulatory Visit (INDEPENDENT_AMBULATORY_CARE_PROVIDER_SITE_OTHER): Payer: BLUE CROSS/BLUE SHIELD | Admitting: Family Medicine

## 2015-12-11 VITALS — BP 140/80 | HR 63 | Resp 18 | Ht 72.0 in | Wt 233.6 lb

## 2015-12-11 DIAGNOSIS — E038 Other specified hypothyroidism: Secondary | ICD-10-CM | POA: Diagnosis not present

## 2015-12-11 DIAGNOSIS — Z23 Encounter for immunization: Secondary | ICD-10-CM | POA: Diagnosis not present

## 2015-12-11 DIAGNOSIS — I10 Essential (primary) hypertension: Secondary | ICD-10-CM

## 2015-12-11 DIAGNOSIS — G43009 Migraine without aura, not intractable, without status migrainosus: Secondary | ICD-10-CM

## 2015-12-11 DIAGNOSIS — F329 Major depressive disorder, single episode, unspecified: Secondary | ICD-10-CM | POA: Diagnosis not present

## 2015-12-11 DIAGNOSIS — G4733 Obstructive sleep apnea (adult) (pediatric): Secondary | ICD-10-CM | POA: Diagnosis not present

## 2015-12-11 DIAGNOSIS — Z0000 Encounter for general adult medical examination without abnormal findings: Secondary | ICD-10-CM

## 2015-12-11 DIAGNOSIS — F32A Depression, unspecified: Secondary | ICD-10-CM

## 2015-12-11 DIAGNOSIS — Z1159 Encounter for screening for other viral diseases: Secondary | ICD-10-CM

## 2015-12-11 DIAGNOSIS — M459 Ankylosing spondylitis of unspecified sites in spine: Secondary | ICD-10-CM | POA: Diagnosis not present

## 2015-12-11 LAB — CBC WITH DIFFERENTIAL/PLATELET
BASOS ABS: 51 {cells}/uL (ref 0–200)
Basophils Relative: 1 %
EOS ABS: 153 {cells}/uL (ref 15–500)
EOS PCT: 3 %
HCT: 48 % (ref 38.5–50.0)
HEMOGLOBIN: 16.3 g/dL (ref 13.2–17.1)
Lymphocytes Relative: 26 %
Lymphs Abs: 1326 cells/uL (ref 850–3900)
MCH: 34.3 pg — AB (ref 27.0–33.0)
MCHC: 34 g/dL (ref 32.0–36.0)
MCV: 101.1 fL — AB (ref 80.0–100.0)
MONOS PCT: 11 %
MPV: 8.9 fL (ref 7.5–12.5)
Monocytes Absolute: 561 cells/uL (ref 200–950)
NEUTROS ABS: 3009 {cells}/uL (ref 1500–7800)
NEUTROS PCT: 59 %
Platelets: 256 10*3/uL (ref 140–400)
RBC: 4.75 MIL/uL (ref 4.20–5.80)
RDW: 13.6 % (ref 11.0–15.0)
WBC: 5.1 10*3/uL (ref 4.0–10.5)

## 2015-12-11 LAB — POCT URINALYSIS DIPSTICK
BILIRUBIN UA: NEGATIVE
Blood, UA: NEGATIVE
Glucose, UA: NEGATIVE
KETONES UA: NEGATIVE
LEUKOCYTES UA: NEGATIVE
Nitrite, UA: NEGATIVE
PH UA: 6.5
PROTEIN UA: NEGATIVE
SPEC GRAV UA: 1.02
Urobilinogen, UA: NEGATIVE

## 2015-12-11 LAB — COMPREHENSIVE METABOLIC PANEL
ALBUMIN: 3.9 g/dL (ref 3.6–5.1)
ALT: 18 U/L (ref 9–46)
AST: 20 U/L (ref 10–35)
Alkaline Phosphatase: 58 U/L (ref 40–115)
BUN: 19 mg/dL (ref 7–25)
CHLORIDE: 102 mmol/L (ref 98–110)
CO2: 27 mmol/L (ref 20–31)
Calcium: 8.9 mg/dL (ref 8.6–10.3)
Creat: 1.16 mg/dL (ref 0.70–1.25)
Glucose, Bld: 95 mg/dL (ref 65–99)
POTASSIUM: 4.3 mmol/L (ref 3.5–5.3)
Sodium: 138 mmol/L (ref 135–146)
TOTAL PROTEIN: 6.9 g/dL (ref 6.1–8.1)
Total Bilirubin: 0.8 mg/dL (ref 0.2–1.2)

## 2015-12-11 LAB — LIPID PANEL
CHOL/HDL RATIO: 3.8 ratio (ref <=5.0)
CHOLESTEROL: 201 mg/dL — AB (ref 125–200)
HDL: 53 mg/dL (ref >=40)
LDL CALC: 133 mg/dL — AB (ref <130)
TRIGLYCERIDES: 74 mg/dL (ref <150)
VLDL: 15 mg/dL (ref <30)

## 2015-12-11 LAB — TSH: TSH: 8.16 mIU/L — ABNORMAL HIGH (ref 0.40–4.50)

## 2015-12-11 MED ORDER — LEVOTHYROXINE SODIUM 100 MCG PO TABS: ORAL_TABLET | ORAL | 3 refills | Status: DC

## 2015-12-11 MED ORDER — INDOMETHACIN 50 MG PO CAPS: ORAL_CAPSULE | ORAL | 5 refills | Status: DC

## 2015-12-11 MED ORDER — LOSARTAN POTASSIUM-HCTZ 100-12.5 MG PO TABS: 1.0000 | ORAL_TABLET | Freq: Every day | ORAL | 3 refills | Status: DC

## 2015-12-11 NOTE — Progress Notes (Signed)
Subjective:    Patient ID: [name] [name], male    DOB: 06-03-1950, 65 y.o.   MRN: [id number]  HPI He is here for a complete examination. Life is treating him quite well. 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 He has cut back on his Effexor to 37.5 but then in the last year has stopped taking it entirely and is noted no change psychologically. He also stopped taking his Norvasc and has been monitoring his blood pressure and it did again has been doing well. He did take Flomax for a short period of time to help with potential renal stones. He continues on Indocin taking this once a day for his ankylosing spondylitis. He rarely has difficulty with his migraine headaches. He has a history of OSA but not significant enough does use a CPAP. He continues on Synthroid and is having no difficulty with that. He otherwise is doing quite well. No allergy symptoms no chest pain shortness of breath or GI issues. Family and social history as well as health maintenance and immunizations were reviewed   Review of Systems  All other systems reviewed and are negative.      Objective:   Physical Exam BP 140/80   Pulse 63   Resp 18   Ht 6' (1.829 m)   Wt 233 lb 9.6 oz (106 kg)   SpO2 94%   BMI 31.68 kg/m   General Appearance:    Alert, cooperative, no distress, appears stated age  Head:    Normocephalic, without obvious abnormality, atraumatic  Eyes:    PERRL, conjunctiva/corneas clear, EOM's intact, fundi    benign  Ears:    Normal TM's and external ear canals  Nose:   Nares normal, mucosa normal, no drainage or sinus   tenderness  Throat:   Lips, mucosa, and tongue normal; teeth and gums normal  Neck:   Supple, no lymphadenopathy;  thyroid:  no   enlargement/tenderness/nodules; no carotid   bruit or JVD     Lungs:     Clear to auscultation bilaterally without wheezes, rales or     ronchi; respirations unlabored      Heart:    Regular rate and rhythm, S1 and S2 normal, no murmur, rub   or gallop     Abdomen:     Soft, non-tender, nondistended, normoactive bowel sounds,    no masses, no hepatosplenomegaly  Genitalia:  Deferred   Rectal:   deferred  Extremities:   No clubbing, cyanosis or edema  Pulses:   2+ and symmetric all extremities  Skin:   Skin color, texture, turgor normal, no rashes or lesions  Lymph nodes:   Cervical, supraclavicular, and axillary nodes normal  Neurologic:   CNII-XII intact, normal strength, sensation and gait; reflexes 2+ and symmetric throughout          Psych:   Normal mood, affect, hygiene and grooming.         Assessment & Plan:  Annual physical exam - Plan: Urinalysis Dipstick, Visual acuity screening, CBC with Differential/Platelet, Comprehensive metabolic panel, Lipid panel  Obstructive sleep apnea  Essential hypertension - Plan: CBC with Differential/Platelet, Comprehensive metabolic panel  Migraine without aura and without status migrainosus, not intractable  Need for prophylactic vaccination and inoculation against influenza - Plan: Flu Vaccine QUAD 36+ mos IM  Other specified hypothyroidism - Plan: TSH  Ankylosing spondylitis, unspecified site of spine (HCC)  Depression, unspecified  depression type  Need for hepatitis C screening test - Plan: Hepatitis C antibody  he is really doing quite well reducing his use of medications as long as his blood pressure and psychologically he stays in good spot we don't need to do anything. Did discuss use of Indocin and recommend use it more on an as-needed basis. Recheck here as needed.

## 2015-12-12 LAB — HEPATITIS C ANTIBODY: HCV Ab: NEGATIVE

## 2015-12-31 ENCOUNTER — Other Ambulatory Visit: Payer: Self-pay | Admitting: Family Medicine

## 2015-12-31 DIAGNOSIS — Z0000 Encounter for general adult medical examination without abnormal findings: Secondary | ICD-10-CM

## 2015-12-31 DIAGNOSIS — I10 Essential (primary) hypertension: Secondary | ICD-10-CM

## 2015-12-31 NOTE — Telephone Encounter (Signed)
Dr.Lalonde have you stopped patients  amlodipine ? I don't see it on his list

## 2015-12-31 NOTE — Telephone Encounter (Signed)
I renewed the thyroid medicine and he is not on amlodipine at the present moment. Not sure why this came through.

## 2016-01-14 ENCOUNTER — Other Ambulatory Visit: Payer: Self-pay | Admitting: Family Medicine

## 2016-01-14 NOTE — Telephone Encounter (Signed)
Is this okay to refill? 

## 2016-01-22 ENCOUNTER — Institutional Professional Consult (permissible substitution): Payer: BLUE CROSS/BLUE SHIELD | Admitting: Family Medicine

## 2016-01-23 ENCOUNTER — Encounter: Payer: Self-pay | Admitting: Family Medicine

## 2016-01-23 ENCOUNTER — Ambulatory Visit (INDEPENDENT_AMBULATORY_CARE_PROVIDER_SITE_OTHER): Payer: BLUE CROSS/BLUE SHIELD | Admitting: Family Medicine

## 2016-01-23 VITALS — BP 126/80 | Wt 237.0 lb

## 2016-01-23 DIAGNOSIS — I10 Essential (primary) hypertension: Secondary | ICD-10-CM

## 2016-01-23 DIAGNOSIS — G43009 Migraine without aura, not intractable, without status migrainosus: Secondary | ICD-10-CM | POA: Diagnosis not present

## 2016-01-23 MED ORDER — AMLODIPINE BESYLATE 5 MG PO TABS: 5.0000 mg | ORAL_TABLET | Freq: Every day | ORAL | 3 refills | Status: DC

## 2016-01-23 NOTE — Progress Notes (Signed)
   Subjective:    Patient ID: [name] [name], male    DOB: [birth year], 65 y.o.   MRN: [id number]  HPI He is here for consult concerning blood pressure. He has noted over roughly the last month his blood pressure started to go up into the 190s over 110 range. Prior to this he had stopped taking his amlodipine and had done fairly well. He recently started taking it again. He has also had some difficulty recently with migraine headache type symptoms however is also has some allergy issues as well as dental work.   Review of Systems     Objective:   Physical Exam Alert and in no distress. Blood pressure is recorded.       Assessment & Plan:  Essential hypertension  Migraine without aura and without status migrainosus, not intractable I will renew his amlodipine. Discussed the fact that we will need work towards getting a lower blood pressure especially in lieu of the new blood pressure guidelines. Also discussed the fact that the headaches could easily be caused by allergy/dental issues. Recommended using Flonase for his allergies. He will also check his blood pressure cuff to ensure accuracy and call me in one month.

## 2016-08-23 ENCOUNTER — Telehealth: Payer: Self-pay | Admitting: Family Medicine

## 2016-08-23 NOTE — Telephone Encounter (Signed)
P.A. Cherene Altes

## 2016-08-25 NOTE — Telephone Encounter (Signed)
[facility] called with additional questions for P.A., should receive response within 24 hours

## 2016-08-28 NOTE — Telephone Encounter (Signed)
P.A. Isabelle Course, pt must have a trial and failure to Codeine, do you want to switch?   Copy of denial letter in your folder

## 2016-08-31 NOTE — Telephone Encounter (Signed)
Appeal letter faxed to [facility] as expedited request.

## 2016-08-31 NOTE — Telephone Encounter (Signed)
PA approved through 08/31/2017, cost is $4.68/month. Pt and pharmacy aware.[name] [name]

## 2016-08-31 NOTE — Telephone Encounter (Signed)
Take care of this 

## 2016-09-13 ENCOUNTER — Other Ambulatory Visit: Payer: Self-pay | Admitting: Family Medicine

## 2016-09-13 MED ORDER — CARISOPRODOL 350 MG PO TABS: 350.0000 mg | ORAL_TABLET | Freq: Four times a day (QID) | ORAL | 0 refills | Status: DC | PRN

## 2016-09-13 NOTE — Progress Notes (Signed)
He is having back spasms.I will give [facility]

## 2016-10-05 ENCOUNTER — Encounter: Payer: Self-pay | Admitting: *Deleted

## 2016-11-24 ENCOUNTER — Telehealth: Payer: Self-pay | Admitting: Family Medicine

## 2016-11-24 DIAGNOSIS — R5383 Other fatigue: Secondary | ICD-10-CM | POA: Insufficient documentation

## 2016-11-24 DIAGNOSIS — E038 Other specified hypothyroidism: Secondary | ICD-10-CM

## 2016-11-24 NOTE — Telephone Encounter (Signed)
I ordered the test

## 2016-11-24 NOTE — Telephone Encounter (Signed)
Pt would like to come in for lab only appointment to check thyroid and testosterone level. Pt said he is due for thyroid labs and would like to add testosterone lab on as well.

## 2016-11-26 NOTE — Telephone Encounter (Signed)
LM for pt to CB. Victorino December

## 2016-11-30 NOTE — Telephone Encounter (Signed)
LM for pt to CB and schedule lab appt. [name] [name] December

## 2016-12-02 ENCOUNTER — Other Ambulatory Visit (INDEPENDENT_AMBULATORY_CARE_PROVIDER_SITE_OTHER): Payer: Medicare Other

## 2016-12-02 DIAGNOSIS — Z23 Encounter for immunization: Secondary | ICD-10-CM

## 2016-12-02 DIAGNOSIS — R5383 Other fatigue: Secondary | ICD-10-CM | POA: Diagnosis not present

## 2016-12-02 DIAGNOSIS — E038 Other specified hypothyroidism: Secondary | ICD-10-CM

## 2016-12-03 LAB — TSH: TSH: 3.54 mIU/L (ref 0.40–4.50)

## 2016-12-03 LAB — TESTOSTERONE: TESTOSTERONE: 472 ng/dL (ref 250–827)

## 2017-01-13 ENCOUNTER — Encounter: Payer: Self-pay | Admitting: Family Medicine

## 2017-01-13 ENCOUNTER — Telehealth: Payer: Self-pay

## 2017-01-13 ENCOUNTER — Ambulatory Visit (INDEPENDENT_AMBULATORY_CARE_PROVIDER_SITE_OTHER): Payer: Medicare Other | Admitting: Family Medicine

## 2017-01-13 VITALS — BP 130/80 | HR 82 | Resp 16 | Wt 223.0 lb

## 2017-01-13 DIAGNOSIS — F329 Major depressive disorder, single episode, unspecified: Secondary | ICD-10-CM | POA: Diagnosis not present

## 2017-01-13 DIAGNOSIS — Z23 Encounter for immunization: Secondary | ICD-10-CM

## 2017-01-13 DIAGNOSIS — F32A Depression, unspecified: Secondary | ICD-10-CM

## 2017-01-13 MED ORDER — VENLAFAXINE HCL ER 37.5 MG PO CP24: 37.5000 mg | ORAL_CAPSULE | Freq: Every day | ORAL | 0 refills | Status: DC

## 2017-01-13 MED ORDER — CLONAZEPAM 0.5 MG PO TABS: 0.5000 mg | ORAL_TABLET | Freq: Two times a day (BID) | ORAL | 0 refills | Status: DC | PRN

## 2017-01-13 NOTE — Progress Notes (Signed)
   Subjective:    Patient ID: [name] [name], male    DOB: [birth year], 66 y.o.   MRN: [id number]  HPI He is here for consult concerning depression. He has a long history of difficulty with dealing with that and has been on Effexor with fairly good results for long period of time. He notes recently that he is starting to wake up early become more agitated occasionally have difficulty with anorexia as well as some mood swings. He recently retired and he and his wife are planning to move to [place]. Also the holidays are arriving. He would like to be placed back on his Effexor. He did take a 75 last night but did have difficulty with it He is apparently also getting some counseling.  Review of Systems     Objective:   Physical Exam Alert and in no distress with appropriate affect       Assessment & Plan:  Depression, unspecified depression type - Plan: venlafaxine XR (EFFEXOR XR) 37.5 MG 24 hr capsule, clonazePAM (KLONOPIN) 0.5 MG tablet  Need for vaccination against Streptococcus pneumoniae - Plan: Pneumococcal conjugate vaccine 13-valent I will start him back on Effexor at a lower dose and increase him after 2 weeks to 75 mg by mouth he is to call me in roughly 1 month. I will use the Klonopin to help with anxiety and potentially with sleep.

## 2017-01-13 NOTE — Telephone Encounter (Signed)
Called in clonazepam per JCL.

## 2017-01-18 ENCOUNTER — Encounter: Payer: Self-pay | Admitting: Internal Medicine

## 2017-01-22 ENCOUNTER — Encounter: Payer: Self-pay | Admitting: Family Medicine

## 2017-01-25 ENCOUNTER — Other Ambulatory Visit: Payer: Self-pay | Admitting: Family Medicine

## 2017-01-25 DIAGNOSIS — F329 Major depressive disorder, single episode, unspecified: Secondary | ICD-10-CM

## 2017-01-25 DIAGNOSIS — F32A Depression, unspecified: Secondary | ICD-10-CM

## 2017-01-25 MED ORDER — VENLAFAXINE HCL ER 75 MG PO CP24: 75.0000 mg | ORAL_CAPSULE | Freq: Every day | ORAL | 1 refills | Status: DC

## 2017-01-25 MED ORDER — CLONAZEPAM 0.5 MG PO TABS: 0.5000 mg | ORAL_TABLET | Freq: Two times a day (BID) | ORAL | 0 refills | Status: DC | PRN

## 2017-01-29 ENCOUNTER — Other Ambulatory Visit: Payer: Self-pay | Admitting: Family Medicine

## 2017-01-29 DIAGNOSIS — I10 Essential (primary) hypertension: Secondary | ICD-10-CM

## 2017-02-01 ENCOUNTER — Other Ambulatory Visit: Payer: Self-pay | Admitting: Family Medicine

## 2017-02-01 DIAGNOSIS — M459 Ankylosing spondylitis of unspecified sites in spine: Secondary | ICD-10-CM

## 2017-02-04 ENCOUNTER — Encounter: Payer: Self-pay | Admitting: Family Medicine

## 2017-02-04 ENCOUNTER — Ambulatory Visit (INDEPENDENT_AMBULATORY_CARE_PROVIDER_SITE_OTHER): Payer: Medicare Other | Admitting: Family Medicine

## 2017-02-04 VITALS — BP 116/80 | HR 76 | Wt 214.6 lb

## 2017-02-04 DIAGNOSIS — M459 Ankylosing spondylitis of unspecified sites in spine: Secondary | ICD-10-CM

## 2017-02-04 DIAGNOSIS — S39012A Strain of muscle, fascia and tendon of lower back, initial encounter: Secondary | ICD-10-CM | POA: Diagnosis not present

## 2017-02-04 DIAGNOSIS — F329 Major depressive disorder, single episode, unspecified: Secondary | ICD-10-CM

## 2017-02-04 DIAGNOSIS — N529 Male erectile dysfunction, unspecified: Secondary | ICD-10-CM

## 2017-02-04 DIAGNOSIS — F32A Depression, unspecified: Secondary | ICD-10-CM

## 2017-02-04 MED ORDER — CARISOPRODOL 350 MG PO TABS: 350.0000 mg | ORAL_TABLET | Freq: Four times a day (QID) | ORAL | 0 refills | Status: DC | PRN

## 2017-02-04 MED ORDER — TADALAFIL 5 MG PO TABS: 5.0000 mg | ORAL_TABLET | Freq: Every day | ORAL | 0 refills | Status: DC | PRN

## 2017-02-04 MED ORDER — VENLAFAXINE HCL ER 150 MG PO CP24: 150.0000 mg | ORAL_CAPSULE | Freq: Every day | ORAL | 1 refills | Status: DC

## 2017-02-04 NOTE — Progress Notes (Signed)
   Subjective:    Patient ID: [name] [name], male    DOB: 07-01-1950, 65 y.o.   MRN: [id number]  HPI He is here for a follow-up visit.  He does note an improvement in his overall demeanor from going to 75 mg of Effexor but feels he is reached a steady state.  In the past he was on a much higher dose of this and is interested in going higher.  He does complain of a flat mood as well as decreased appetite and sleep disturbance.  He has had no emotional outbursts or suicidal ideation.  Recently he did some lifting of some objects at home and is now having some back pain.  He has responded well in the past to [facility] and would like a refill on this.  He does have a history of ankylosing spondylitis.  He also has had erectile dysfunction and difficulties and in the past had apparently tried Viagra and Cialis.  He did have difficulty with headaches from this.   Review of Systems     Objective:   Physical Exam   Alert and in no distress with appropriate affect.       Assessment & Plan:  Depression, unspecified depression type - Plan: venlafaxine XR (EFFEXOR XR) 150 MG 24 hr capsule  Ankylosing spondylitis, unspecified site of spine (HCC)  Erectile dysfunction, unspecified erectile dysfunction type - Plan: tadalafil (CIALIS) 5 MG tablet  Back strain, initial encounter - Plan: carisoprodol (SOMA) 350 MG tablet  Increase his Effexor.  He will keep me informed as how this is working. He will also use the [name] [name] which has worked well in the past for this. Discussed the erectile dysfunction with him.  I will write a prescription with a discount card.  Discussed the use of the discount card and Medicare.  Also discussed to use the minimum effective dose and see if this helps with erections but minimizes the headache.  Explained that the headache was probably Viagra related not necessarily Cialis.

## 2017-02-10 ENCOUNTER — Other Ambulatory Visit: Payer: Self-pay | Admitting: Family Medicine

## 2017-02-10 DIAGNOSIS — I10 Essential (primary) hypertension: Secondary | ICD-10-CM

## 2017-02-17 ENCOUNTER — Telehealth: Payer: Self-pay | Admitting: Family Medicine

## 2017-02-17 ENCOUNTER — Other Ambulatory Visit: Payer: Self-pay | Admitting: Family Medicine

## 2017-02-17 DIAGNOSIS — E038 Other specified hypothyroidism: Secondary | ICD-10-CM

## 2017-02-17 MED ORDER — LEVOTHYROXINE SODIUM 100 MCG PO TABS: ORAL_TABLET | ORAL | 0 refills | Status: DC

## 2017-02-17 NOTE — Telephone Encounter (Signed)
Called pt reached vm needs cpe

## 2017-02-22 ENCOUNTER — Other Ambulatory Visit: Payer: Self-pay | Admitting: Family Medicine

## 2017-02-22 DIAGNOSIS — F32A Depression, unspecified: Secondary | ICD-10-CM

## 2017-02-22 DIAGNOSIS — F329 Major depressive disorder, single episode, unspecified: Secondary | ICD-10-CM

## 2017-02-28 ENCOUNTER — Telehealth: Payer: Self-pay | Admitting: Family Medicine

## 2017-02-28 NOTE — Telephone Encounter (Signed)
P.A. CARISPRODOL

## 2017-02-28 NOTE — Telephone Encounter (Signed)
P.A. TADALAFIL completed

## 2017-03-04 ENCOUNTER — Encounter: Payer: Self-pay | Admitting: Family Medicine

## 2017-03-04 DIAGNOSIS — F329 Major depressive disorder, single episode, unspecified: Secondary | ICD-10-CM

## 2017-03-04 DIAGNOSIS — F32A Depression, unspecified: Secondary | ICD-10-CM

## 2017-03-04 MED ORDER — VENLAFAXINE HCL ER 75 MG PO CP24: ORAL_CAPSULE | ORAL | 5 refills | Status: DC

## 2017-03-29 ENCOUNTER — Telehealth: Payer: Self-pay | Admitting: Family Medicine

## 2017-03-31 ENCOUNTER — Telehealth: Payer: Self-pay | Admitting: Family Medicine

## 2017-03-31 ENCOUNTER — Other Ambulatory Visit: Payer: Self-pay | Admitting: Family Medicine

## 2017-03-31 DIAGNOSIS — F32A Depression, unspecified: Secondary | ICD-10-CM

## 2017-03-31 DIAGNOSIS — F329 Major depressive disorder, single episode, unspecified: Secondary | ICD-10-CM

## 2017-03-31 MED ORDER — CLONAZEPAM 0.5 MG PO TABS: 0.5000 mg | ORAL_TABLET | Freq: Two times a day (BID) | ORAL | 2 refills | Status: DC | PRN

## 2017-03-31 NOTE — Telephone Encounter (Signed)
[facility] and [facility] state [facility] cannot since original rx was filled at a different location. [facility]am [facility].  Copy of req in your folder

## 2017-04-07 NOTE — Telephone Encounter (Signed)
P.A. Denied, Medicare does not cover any ED meds.  Called and spoke with pt and advised of cash pay options and he would like to go with Sildenafil at [facility] [address] for $95.  Is this ok to switch?

## 2017-04-07 NOTE — Telephone Encounter (Signed)
P.A. Approved til 02/28/18, pt informed

## 2017-04-08 MED ORDER — SILDENAFIL CITRATE 20 MG PO TABS: ORAL_TABLET | ORAL | 1 refills | Status: DC

## 2017-04-08 NOTE — Telephone Encounter (Signed)
dt ?

## 2017-04-19 ENCOUNTER — Encounter: Payer: Medicare Other | Admitting: Family Medicine

## 2017-04-20 ENCOUNTER — Encounter: Payer: Self-pay | Admitting: Internal Medicine

## 2017-04-29 ENCOUNTER — Other Ambulatory Visit: Payer: Self-pay | Admitting: Family Medicine

## 2017-04-29 DIAGNOSIS — I10 Essential (primary) hypertension: Secondary | ICD-10-CM

## 2017-05-13 ENCOUNTER — Other Ambulatory Visit: Payer: Self-pay | Admitting: Family Medicine

## 2017-05-13 DIAGNOSIS — E038 Other specified hypothyroidism: Secondary | ICD-10-CM

## 2017-05-20 ENCOUNTER — Ambulatory Visit (INDEPENDENT_AMBULATORY_CARE_PROVIDER_SITE_OTHER): Payer: Medicare Other | Admitting: Family Medicine

## 2017-05-20 ENCOUNTER — Ambulatory Visit (INDEPENDENT_AMBULATORY_CARE_PROVIDER_SITE_OTHER): Payer: Medicare Other | Admitting: Podiatry

## 2017-05-20 ENCOUNTER — Encounter: Payer: Self-pay | Admitting: Family Medicine

## 2017-05-20 VITALS — BP 132/86 | HR 74 | Temp 98.0°F | Ht 72.0 in | Wt 219.4 lb

## 2017-05-20 DIAGNOSIS — B351 Tinea unguium: Secondary | ICD-10-CM

## 2017-05-20 DIAGNOSIS — I10 Essential (primary) hypertension: Secondary | ICD-10-CM | POA: Diagnosis not present

## 2017-05-20 DIAGNOSIS — M459 Ankylosing spondylitis of unspecified sites in spine: Secondary | ICD-10-CM | POA: Diagnosis not present

## 2017-05-20 DIAGNOSIS — K589 Irritable bowel syndrome without diarrhea: Secondary | ICD-10-CM | POA: Diagnosis not present

## 2017-05-20 DIAGNOSIS — Z87442 Personal history of urinary calculi: Secondary | ICD-10-CM

## 2017-05-20 DIAGNOSIS — Z0000 Encounter for general adult medical examination without abnormal findings: Secondary | ICD-10-CM

## 2017-05-20 DIAGNOSIS — G4733 Obstructive sleep apnea (adult) (pediatric): Secondary | ICD-10-CM

## 2017-05-20 DIAGNOSIS — G43009 Migraine without aura, not intractable, without status migrainosus: Secondary | ICD-10-CM | POA: Diagnosis not present

## 2017-05-20 DIAGNOSIS — Z125 Encounter for screening for malignant neoplasm of prostate: Secondary | ICD-10-CM | POA: Diagnosis not present

## 2017-05-20 DIAGNOSIS — F329 Major depressive disorder, single episode, unspecified: Secondary | ICD-10-CM | POA: Diagnosis not present

## 2017-05-20 DIAGNOSIS — F32A Depression, unspecified: Secondary | ICD-10-CM

## 2017-05-20 DIAGNOSIS — Z13220 Encounter for screening for lipoid disorders: Secondary | ICD-10-CM | POA: Diagnosis not present

## 2017-05-20 DIAGNOSIS — E038 Other specified hypothyroidism: Secondary | ICD-10-CM | POA: Diagnosis not present

## 2017-05-20 MED ORDER — LOSARTAN POTASSIUM-HCTZ 100-12.5 MG PO TABS: 1.0000 | ORAL_TABLET | Freq: Every day | ORAL | 3 refills | Status: DC

## 2017-05-20 MED ORDER — VENLAFAXINE HCL ER 75 MG PO CP24: ORAL_CAPSULE | ORAL | 3 refills | Status: DC

## 2017-05-20 MED ORDER — LEVOTHYROXINE SODIUM 100 MCG PO TABS: ORAL_TABLET | ORAL | 3 refills | Status: DC

## 2017-05-20 MED ORDER — AMLODIPINE BESYLATE 5 MG PO TABS: 5.0000 mg | ORAL_TABLET | Freq: Every day | ORAL | 3 refills | Status: DC

## 2017-05-20 MED ORDER — INDOMETHACIN 50 MG PO CAPS: ORAL_CAPSULE | ORAL | 3 refills | Status: DC

## 2017-05-20 NOTE — Progress Notes (Signed)
[name] [name] is a 67 y.o. male who presents for annual wellness visit and follow-up on chronic medical conditions.  He has the following concerns: He does have underlying ankylosing spondylitis and is doing well with this using Indocin up to twice per day but usually less than that.  He is known to her 25 mg of Effexor and states that he psychologically is back to his normal self.  He does have OSA but is not using CPAP.  Continues on his blood pressure medications and having no trouble with him.  Migraine headaches cause him very little difficulty.  He recently saw his podiatrist and does have onychomycosis.  He is considering having laser therapy for that.  He has a previous history of migraine but has not had any in the recent past.  Also IBS is not causing much difficulty.  Does have a family history of colon cancer and is scheduled for colonoscopy in the next couple of years.  [place].   Immunizations and Health Maintenance Immunization History  Administered Date(s) Administered  . Influenza Whole 12/18/1991, 11/30/2000  . Influenza, High Dose Seasonal PF 12/02/2016  . Influenza, Seasonal, Injecte, Preservative Fre 03/25/2012  . Influenza,inj,Quad PF,6+ Mos 10/09/2013, 11/26/2014, 12/11/2015  . Pneumococcal Conjugate-13 01/13/2017  . Pneumococcal Polysaccharide-23 10/28/2006  . Tdap 10/28/2006  . Zoster 10/07/2011   Health Maintenance Due  Topic Date Due  . HIV Screening  06/14/1966  . TETANUS/TDAP  10/27/2016  . COLONOSCOPY  12/27/2016    Last colonoscopy: five years scheduled for one  Last PSA: not had one  Dentist: 3 weeks ago Ophtho: over one year Exercise: running 5 miles in gym. Reg work out schedule 5days a week  Other doctors caring for patient include: Sharlett Iles  Advanced Directives: No.  Information given.    Depression screen:  See quest ionnaire below.     Depression screen [facility] 2/9 05/20/2017  01/13/2017  Decreased Interest 0 2  Down, Depressed, Hopeless - 1  PHQ - 2 Score 0 3  Altered sleeping - 2  Tired, decreased energy - 2  Change in appetite - 2  Feeling bad or failure about yourself  - 0  Trouble concentrating - 1  Moving slowly or fidgety/restless - 1  Suicidal thoughts - 0  PHQ-9 Score - 11  Difficult doing work/chores - Somewhat difficult    Fall Screen: See Questionaire below.   Fall Risk  05/20/2017  Falls in the past year? No    ADL screen:  See questionnaire below.  Functional Status Survey:  Normal   Review of Systems  Constitutional: -, -unexpected weight change, -anorexia, -fatigue Allergy: -sneezing, -itching, -congestion Dermatology: denies changing moles, rash, lumps ENT: -runny nose, -ear pain, -sore throat,  Cardiology:  -chest pain, -palpitations, -orthopnea, Respiratory: -cough, -shortness of breath, -dyspnea on exertion, -wheezing,  Gastroenterology: -abdominal pain, -nausea, -vomiting, -diarrhea, -constipation, -dysphagia Hematology: -bleeding or bruising problems Musculoskeletal: -arthralgias, -myalgias, -joint swelling, -back pain, - Ophthalmology: -vision changes,  Urology: -dysuria, -difficulty urinating,  -urinary frequency, -urgency, incontinence Neurology: -, -numbness, , -memory loss, -falls, -dizziness    PHYSICAL EXAM:   General Appearance: Alert, cooperative, no distress, appears stated age Head: Normocephalic, without obvious abnormality, atraumatic Eyes: PERRL, conjunctiva/corneas clear, EOM's intact, fundi benign Ears: Normal TM's and external ear canals Nose: Nares normal, mucosa normal, no drainage or sinus   tenderness Throat: Lips, mucosa, and tongue normal; teeth and gums normal  Neck: Supple, no lymphadenopathy, thyroid:no enlargement/tenderness/nodules; no carotid bruit or JVD Lungs: Clear to auscultation bilaterally without wheezes, rales or ronchi; respirations unlabored Heart: Regular rate and rhythm, S1  and S2 normal, no murmur, rub or gallop Abdomen: Soft, non-tender, nondistended, normoactive bowel sounds, no masses, no hepatosplenomegaly Extremities: No clubbing, cyanosis or edema Pulses: 2+ and symmetric all extremities Skin: Skin color, texture, turgor normal, no rashes or lesions Lymph nodes: Cervical, supraclavicular, and axillary nodes normal Neurologic: CNII-XII intact, normal strength, sensation and gait; reflexes 2+ and symmetric throughout   Psych: Normal mood, affect, hygiene and grooming  ASSESSMENT/PLAN: Depression, unspecified depression type - Plan: venlafaxine XR (EFFEXOR XR) 75 MG 24 hr capsule  Essential hypertension - Plan: CBC with Differential/Platelet, Comprehensive metabolic panel, amLODipine (NORVASC) 5 MG tablet, losartan-hydrochlorothiazide (HYZAAR) 100-12.5 MG tablet  Other specified hypothyroidism - Plan: TSH, levothyroxine (SYNTHROID, LEVOTHROID) 100 MCG tablet  Migraine without aura and without status migrainosus, not intractable  Ankylosing spondylitis, unspecified site of spine (HCC) - Plan: CBC with Differential/Platelet, Comprehensive metabolic panel, indomethacin (INDOCIN) 50 MG capsule  RENAL CALCULUS, HX OF - Plan: CBC with Differential/Platelet, Comprehensive metabolic panel  Obstructive sleep apnea  Irritable bowel syndrome, unspecified type  Routine general medical examination at a health care facility - Plan: CBC with Differential/Platelet, Comprehensive metabolic panel, Lipid panel  Screening for prostate cancer - Plan: PSA, Medicare ([place])  Screening for lipid disorders - Plan: Lipid panel Depression, unspecified depression type - Plan: venlafaxine XR (EFFEXOR XR) 75 MG 24 hr capsule  Essential hypertension - Plan: CBC with Differential/Platelet, Comprehensive metabolic panel, amLODipine (NORVASC) 5 MG tablet, losartan-hydrochlorothiazide (HYZAAR) 100-12.5 MG tablet  Other specified hypothyroidism - Plan: TSH, levothyroxine  (SYNTHROID, LEVOTHROID) 100 MCG tablet  Migraine without aura and without status migrainosus, not intractable  Ankylosing spondylitis, unspecified site of spine (HCC) - Plan: CBC with Differential/Platelet, Comprehensive metabolic panel, indomethacin (INDOCIN) 50 MG capsule  RENAL CALCULUS, HX OF - Plan: CBC with Differential/Platelet, Comprehensive metabolic panel  Obstructive sleep apnea  Irritable bowel syndrome, unspecified type  Routine general medical examination at a health care facility - Plan: CBC with Differential/Platelet, Comprehensive metabolic panel, Lipid panel  Screening for prostate cancer - Plan: PSA, Medicare ([place])  Screening for lipid disorders - Plan: Lipid panel He will continue on his present medication regimen.  Also recommend he get the Shingrix and Tdap at the pharmacy. Continue to follow-up with podiatry concerning the onychomycosis.    Medicare Attestation I have personally reviewed: The patient's medical and social history Their use of alcohol, tobacco or illicit drugs Their current medications and supplements The patient's functional ability including ADLs,fall risks, home safety risks, cognitive, and hearing and visual impairment Diet and physical activities Evidence for depression or mood disorders  The patient's weight, height, and BMI have been recorded in the chart.  I have made referrals, counseling, and provided education to the patient based on review of the above and I have provided the patient with a written personalized care plan for preventive services.     Jill Alexanders, MD   05/20/2017

## 2017-05-20 NOTE — Patient Instructions (Signed)
  Mr. [name] [name] , Thank you for taking time to come for your Medicare Wellness Visit. I appreciate your ongoing commitment to your health goals. Please review the following plan we discussed and let me know if I can assist you in the future.   These are the goals we discussed: Goals    None      This is a list of the screening recommended for you and due dates:  Health Maintenance  Topic Date Due  . HIV Screening  06/14/1966  . Tetanus Vaccine  10/27/2016  . Colon Cancer Screening  12/27/2016  . Flu Shot  09/16/2017  . Pneumonia vaccines (2 of 2 - PPSV23) 01/13/2018  .  Hepatitis C: One time screening is recommended by Center for Disease Control  (CDC) for  adults born from [birth year] through 1965.   Completed

## 2017-05-21 ENCOUNTER — Telehealth: Payer: Self-pay | Admitting: Podiatry

## 2017-05-21 LAB — CBC WITH DIFFERENTIAL/PLATELET
BASOS ABS: 0.1 10*3/uL (ref 0.0–0.2)
Basos: 1 %
EOS (ABSOLUTE): 0.2 10*3/uL (ref 0.0–0.4)
Eos: 4 %
HEMOGLOBIN: 15.9 g/dL (ref 13.0–17.7)
Hematocrit: 46.3 % (ref 37.5–51.0)
IMMATURE GRANS (ABS): 0 10*3/uL (ref 0.0–0.1)
Immature Granulocytes: 0 %
LYMPHS: 29 %
Lymphocytes Absolute: 1.7 10*3/uL (ref 0.7–3.1)
MCH: 34.6 pg — AB (ref 26.6–33.0)
MCHC: 34.3 g/dL (ref 31.5–35.7)
MCV: 101 fL — ABNORMAL HIGH (ref 79–97)
MONOCYTES: 9 %
Monocytes Absolute: 0.5 10*3/uL (ref 0.1–0.9)
NEUTROS ABS: 3.3 10*3/uL (ref 1.4–7.0)
Neutrophils: 57 %
PLATELETS: 222 10*3/uL (ref 150–379)
RBC: 4.59 x10E6/uL (ref 4.14–5.80)
RDW: 13.7 % (ref 12.3–15.4)
WBC: 5.7 10*3/uL (ref 3.4–10.8)

## 2017-05-21 LAB — COMPREHENSIVE METABOLIC PANEL
ALBUMIN: 4.4 g/dL (ref 3.6–4.8)
ALK PHOS: 71 IU/L (ref 39–117)
ALT: 23 IU/L (ref 0–44)
AST: 26 IU/L (ref 0–40)
Albumin/Globulin Ratio: 1.6 (ref 1.2–2.2)
BILIRUBIN TOTAL: 0.5 mg/dL (ref 0.0–1.2)
BUN/Creatinine Ratio: 18 (ref 10–24)
BUN: 20 mg/dL (ref 8–27)
CHLORIDE: 100 mmol/L (ref 96–106)
CO2: 26 mmol/L (ref 20–29)
CREATININE: 1.14 mg/dL (ref 0.76–1.27)
Calcium: 9.3 mg/dL (ref 8.6–10.2)
GFR calc Af Amer: 78 mL/min/{1.73_m2} (ref >59)
GFR calc non Af Amer: 67 mL/min/{1.73_m2} (ref >59)
GLUCOSE: 94 mg/dL (ref 65–99)
Globulin, Total: 2.7 g/dL (ref 1.5–4.5)
Potassium: 4.8 mmol/L (ref 3.5–5.2)
Sodium: 142 mmol/L (ref 134–144)
TOTAL PROTEIN: 7.1 g/dL (ref 6.0–8.5)

## 2017-05-21 LAB — TSH: TSH: 3.46 u[IU]/mL (ref 0.450–4.500)

## 2017-05-21 LAB — LIPID PANEL
CHOLESTEROL TOTAL: 210 mg/dL — AB (ref 100–199)
Chol/HDL Ratio: 3 ratio (ref 0.0–5.0)
HDL: 71 mg/dL (ref >39)
LDL CALC: 129 mg/dL — AB (ref 0–99)
Triglycerides: 52 mg/dL (ref 0–149)
VLDL CHOLESTEROL CAL: 10 mg/dL (ref 5–40)

## 2017-05-21 LAB — PSA: Prostate Specific Ag, Serum: 1.3 ng/mL (ref 0.0–4.0)

## 2017-05-21 NOTE — Telephone Encounter (Signed)
I saw Dr. March Rummage yesterday and he said he was going to prescribe medication for my toenail fungus. That was never [facility] to [facility] [address]. Also I had blood work done yesterday to check my liver enzymes and I wanted to know if those results have come in yet. You can call me back at [phone].

## 2017-05-28 ENCOUNTER — Telehealth: Payer: Self-pay | Admitting: Podiatry

## 2017-05-28 NOTE — Telephone Encounter (Signed)
I saw Dr. March Rummage last week and he was going to send in a prescription for toenail fungus. As of now, that has not yet been done. If you could check into that and give me a call back at [phone].

## 2017-06-13 NOTE — Progress Notes (Signed)
  Subjective:  Patient ID: [name] [name], male    DOB: October 16, 1950,  MRN: [id number]  Chief Complaint  Patient presents with  . Nail Problem    bilateral elongated thickened discolored toenails   67 y.o. male presents with the above complaint.  Reports discolored toenails bilaterally.  Denies prior treatments. Past Medical History:  Diagnosis Date  . Ankylosing spondylitis ([place])   . Hypertension   . Migraine   . Thyroid disease    HYPOTHROID   Past Surgical History:  Procedure Laterality Date  . [place]   left  . RECONSTRUCTION OF NOSE  1966  . TONSILLECTOMY  1963    Current Outpatient Medications:  .  amLODipine (NORVASC) 5 MG tablet, Take 1 tablet (5 mg total) by mouth daily., Disp: 90 tablet, Rfl: 3 .  carisoprodol (SOMA) 350 MG tablet, Take 1 tablet (350 mg total) by mouth 4 (four) times daily as needed for muscle spasms., Disp: 30 tablet, Rfl: 0 .  clonazePAM (KLONOPIN) 0.5 MG tablet, Take 1 tablet (0.5 mg total) by mouth 2 (two) times daily as needed for anxiety., Disp: 30 tablet, Rfl: 2 .  indomethacin (INDOCIN) 50 MG capsule, TAKE ONE CAPSULE TWICE A DAY WITH A MEAL, Disp: 180 capsule, Rfl: 3 .  levothyroxine (SYNTHROID, LEVOTHROID) 100 MCG tablet, TAKE 1 TABLET BY MOUTH EVERY DAY, Disp: 90 tablet, Rfl: 3 .  losartan-hydrochlorothiazide (HYZAAR) 100-12.5 MG tablet, Take 1 tablet by mouth daily., Disp: 90 tablet, Rfl: 3 .  sildenafil (REVATIO) 20 MG tablet, Use between 1 and 5 pills daily as needed for erections (Patient not taking: Reported on 05/20/2017), Disp: 90 tablet, Rfl: 1 .  SUMAtriptan (IMITREX) 100 MG tablet, USE AS DIRECTED, Disp: 10 tablet, Rfl: 2 .  venlafaxine XR (EFFEXOR XR) 75 MG 24 hr capsule, Take 3 capsules daily, Disp: 270 capsule, Rfl: 3  Allergies  Allergen Reactions  . Ace Inhibitors Swelling   Review of Systems: Negative except as noted in the HPI. Denies N/V/F/Ch. Objective:  There were no vitals filed for this  visit. General AA&O x3. Normal mood and affect.  Vascular Dorsalis pedis and posterior tibial pulses  present 2+ bilaterally  Capillary refill normal to all digits. Pedal hair growth normal.  Neurologic Epicritic sensation grossly present.  Dermatologic No open lesions. Interspaces clear of maceration. Nails well groomed and normal in appearance. Discoloration toenails bilat.  Orthopedic: MMT 5/5 in dorsiflexion, plantarflexion, inversion, and eversion. Normal joint ROM without pain or crepitus.   Assessment & Plan:  Patient was evaluated and treated and all questions answered.  Onychomycosis -Educated on etiology of nail fungus. -Baseline liver function studies ordered. -  Return in about 5 weeks (around 06/24/2017) for Nail Fungus.

## 2017-06-17 ENCOUNTER — Ambulatory Visit (AMBULATORY_SURGERY_CENTER): Payer: Self-pay | Admitting: *Deleted

## 2017-06-17 ENCOUNTER — Other Ambulatory Visit: Payer: Self-pay

## 2017-06-17 VITALS — Ht 72.0 in | Wt 219.2 lb

## 2017-06-17 DIAGNOSIS — Z800 Family history of malignant neoplasm of digestive organs: Secondary | ICD-10-CM

## 2017-06-17 MED ORDER — NA SULFATE-K SULFATE-MG SULF 17.5-3.13-1.6 GM/177ML PO SOLN: 1.0000 [IU] | Freq: Once | ORAL | 0 refills | Status: AC

## 2017-06-17 NOTE — Progress Notes (Signed)
No egg or soy allergy known to patient  No issues with past sedation with any surgeries  or procedures, no intubation problems  No diet pills per patient No home 02 use per patient  No blood thinners per patient  Pt denies issues with constipation  No A fib or A flutter  EMMI video sent to pt's e mail pt declined   

## 2017-06-21 ENCOUNTER — Telehealth: Payer: Self-pay | Admitting: Podiatry

## 2017-06-21 NOTE — Telephone Encounter (Signed)
Left message informing pt that Dr. March [name] [name] preferred to discuss the medications and treatments with pt in office and that I did not see he had an appt to discuss the labs or treatments, to call me and I would help him reschedule.

## 2017-06-21 NOTE — Telephone Encounter (Signed)
Patient called and canceled apt for this week b/c he was suppose to have a medication to help clear up his toe nail fungus and never received it. He has called twice before last month and still never received it. If you can give patient a call back at [phone]

## 2017-06-24 ENCOUNTER — Ambulatory Visit: Payer: Medicare Other | Admitting: Podiatry

## 2017-06-29 ENCOUNTER — Other Ambulatory Visit: Payer: Self-pay | Admitting: Family Medicine

## 2017-06-29 DIAGNOSIS — F329 Major depressive disorder, single episode, unspecified: Secondary | ICD-10-CM

## 2017-06-29 DIAGNOSIS — F32A Depression, unspecified: Secondary | ICD-10-CM

## 2017-06-29 NOTE — Telephone Encounter (Signed)
[facility] Please advise [facility]

## 2017-07-01 ENCOUNTER — Encounter: Payer: Self-pay | Admitting: Internal Medicine

## 2017-07-01 ENCOUNTER — Ambulatory Visit (AMBULATORY_SURGERY_CENTER): Payer: Medicare Other | Admitting: Internal Medicine

## 2017-07-01 VITALS — BP 116/83 | HR 64 | Temp 98.2°F | Resp 13 | Ht 72.0 in | Wt 219.0 lb

## 2017-07-01 DIAGNOSIS — Z800 Family history of malignant neoplasm of digestive organs: Secondary | ICD-10-CM | POA: Diagnosis not present

## 2017-07-01 DIAGNOSIS — D126 Benign neoplasm of colon, unspecified: Secondary | ICD-10-CM | POA: Diagnosis not present

## 2017-07-01 DIAGNOSIS — I10 Essential (primary) hypertension: Secondary | ICD-10-CM | POA: Diagnosis not present

## 2017-07-01 DIAGNOSIS — E039 Hypothyroidism, unspecified: Secondary | ICD-10-CM | POA: Diagnosis not present

## 2017-07-01 DIAGNOSIS — Z1211 Encounter for screening for malignant neoplasm of colon: Secondary | ICD-10-CM | POA: Diagnosis not present

## 2017-07-01 DIAGNOSIS — D120 Benign neoplasm of cecum: Secondary | ICD-10-CM

## 2017-07-01 DIAGNOSIS — Z86010 Personal history of colonic polyps: Secondary | ICD-10-CM | POA: Diagnosis not present

## 2017-07-01 DIAGNOSIS — K635 Polyp of colon: Secondary | ICD-10-CM | POA: Diagnosis not present

## 2017-07-01 MED ORDER — SODIUM CHLORIDE 0.9 % IV SOLN: 500.0000 mL | Freq: Once | INTRAVENOUS | Status: AC

## 2017-07-01 NOTE — Progress Notes (Signed)
Pt's states no medical or surgical changes since previsit or office visit. 

## 2017-07-01 NOTE — Progress Notes (Signed)
Report given to PACU, [facility] 

## 2017-07-01 NOTE — Progress Notes (Signed)
Called to room to assist during endoscopic procedure.  Patient ID and intended procedure confirmed with present staff. Received instructions for my participation in the procedure from the performing physician.  

## 2017-07-01 NOTE — Op Note (Signed)
[place] Patient Name: [name] [name] Procedure Date: 07/01/2017 10:12 AM MRN: [id number] Endoscopist: Jerene Bears , MD Age: 67 Referring MD:  Date of Birth: [birth year] Gender: Male Account #: [id number] Procedure:                Colonoscopy Indications:              Screening in patient at increased risk: Family                            history of 1st-degree relative with colorectal                            cancer, Last colonoscopy 5 years ago Medicines:                Monitored Anesthesia Care Procedure:                Pre-Anesthesia Assessment:                           - Prior to the procedure, a History and Physical                            was performed, and patient medications and                            allergies were reviewed. The patient's tolerance of                            previous anesthesia was also reviewed. The risks                            and benefits of the procedure and the sedation                            options and risks were discussed with the patient.                            All questions were answered, and informed consent                            was obtained. Prior Anticoagulants: The patient has                            taken no previous anticoagulant or antiplatelet                            agents. ASA Grade Assessment: II - A patient with                            mild systemic disease. After reviewing the risks                            and benefits, the patient was deemed in  satisfactory condition to undergo the procedure.                           After obtaining informed consent, the colonoscope                            was passed under direct vision. Throughout the                            procedure, the patient's blood pressure, pulse, and                            oxygen saturations were monitored continuously. The                            Colonoscope was introduced  through the anus and                            advanced to the cecum, identified by appendiceal                            orifice and ileocecal valve. The colonoscopy was                            performed without difficulty. The patient tolerated                            the procedure well. The quality of the bowel                            preparation was good. The ileocecal valve,                            appendiceal orifice, and rectum were photographed. Scope In: 10:29:02 AM Scope Out: 10:48:12 AM Scope Withdrawal Time: 0 hours 14 minutes 14 seconds  Total Procedure Duration: 0 hours 19 minutes 10 seconds  Findings:                 The digital rectal exam was normal.                           A 5 mm polyp was found in the cecum. The polyp was                            sessile. The polyp was removed with a cold snare.                            Resection and retrieval were complete.                           A 8 mm polyp was found in the ileocecal valve. The                            polyp was sessile.  The polyp was removed with a                            cold snare. Resection and retrieval were complete.                           Internal hemorrhoids were found during                            retroflexion. The hemorrhoids were small. Complications:            No immediate complications. Estimated Blood Loss:     Estimated blood loss was minimal. Impression:               - One 5 mm polyp in the cecum, removed with a cold                            snare. Resected and retrieved.                           - One 8 mm polyp at the ileocecal valve, removed                            with a cold snare. Resected and retrieved.                           - Small internal hemorrhoids. Recommendation:           - Patient has a contact number available for                            emergencies. The signs and symptoms of potential                            delayed complications  were discussed with the                            patient. Return to normal activities tomorrow.                            Written discharge instructions were provided to the                            patient.                           - Resume previous diet.                           - Continue present medications.                           - Await pathology results.                           - Repeat colonoscopy in 5 years for surveillance. Jerene Bears, MD 07/01/2017 10:59:20  AM This report has been signed electronically.

## 2017-07-01 NOTE — Patient Instructions (Signed)
Information on polyps given.   YOU HAD AN ENDOSCOPIC PROCEDURE TODAY AT THE [facility]:   Refer to the procedure report that was given to you for any specific questions about what was found during the examination.  If the procedure report does not answer your questions, please call your gastroenterologist to clarify.  If you requested that your care partner not be given the details of your procedure findings, then the procedure report has been included in a sealed envelope for you to review at your convenience later.  YOU SHOULD EXPECT: Some feelings of bloating in the abdomen. Passage of more gas than usual.  Walking can help get rid of the air that was put into your GI tract during the procedure and reduce the bloating. If you had a lower endoscopy (such as a colonoscopy or flexible sigmoidoscopy) you may notice spotting of blood in your stool or on the toilet paper. If you underwent a bowel prep for your procedure, you may not have a normal bowel movement for a few days.  Please Note:  You might notice some irritation and congestion in your nose or some drainage.  This is from the oxygen used during your procedure.  There is no need for concern and it should clear up in a day or so.  SYMPTOMS TO REPORT IMMEDIATELY:   Following lower endoscopy (colonoscopy or flexible sigmoidoscopy):  Excessive amounts of blood in the stool  Significant tenderness or worsening of abdominal pains  Swelling of the abdomen that is new, acute  Fever of 100F or higher     For urgent or emergent issues, a gastroenterologist can be reached at any hour by calling [phone].   DIET:  We do recommend a small meal at first, but then you may proceed to your regular diet.  Drink plenty of fluids but you should avoid alcoholic beverages for 24 hours.  ACTIVITY:  You should plan to take it easy for the rest of today and you should NOT DRIVE or use heavy machinery until tomorrow (because of the  sedation medicines used during the test).    FOLLOW UP: Our staff will call the number listed on your records the next business day following your procedure to check on you and address any questions or concerns that you may have regarding the information given to you following your procedure. If we do not reach you, we will leave a message.  However, if you are feeling well and you are not experiencing any problems, there is no need to return our call.  We will assume that you have returned to your regular daily activities without incident.  If any biopsies were taken you will be contacted by phone or by letter within the next 1-3 weeks.  Please call us at [phone] if you have not heard about the biopsies in 3 weeks.    SIGNATURES/CONFIDENTIALITY: You and/or your care partner have signed paperwork which will be entered into your electronic medical record.  These signatures attest to the fact that that the information above on your After Visit Summary has been reviewed and is understood.  Full responsibility of the confidentiality of this discharge information lies with you and/or your care-partner. 

## 2017-07-02 ENCOUNTER — Telehealth: Payer: Self-pay | Admitting: *Deleted

## 2017-07-02 NOTE — Telephone Encounter (Signed)
First call attempt. No voicemail to leave message.

## 2017-07-02 NOTE — Telephone Encounter (Signed)
  Follow up Call-  Call back number 07/01/2017  Post procedure Call Back phone  # [phone]  Permission to leave phone message Yes  Some recent data might be hidden     Patient questions:  Do you have a fever, pain , or abdominal swelling? No. Pain Score  0 *  Have you tolerated food without any problems? Yes.    Have you been able to return to your normal activities? Yes.    Do you have any questions about your discharge instructions: Diet   No. Medications  No. Follow up visit  No.  Do you have questions or concerns about your Care? No.  Actions: * If pain score is 4 or above: No action needed, pain <4.

## 2017-07-07 ENCOUNTER — Encounter: Payer: Self-pay | Admitting: Internal Medicine

## 2017-08-05 ENCOUNTER — Other Ambulatory Visit: Payer: Self-pay | Admitting: Family Medicine

## 2017-08-05 ENCOUNTER — Encounter: Payer: Self-pay | Admitting: Family Medicine

## 2017-08-05 DIAGNOSIS — S39012A Strain of muscle, fascia and tendon of lower back, initial encounter: Secondary | ICD-10-CM

## 2017-08-05 MED ORDER — CARISOPRODOL 350 MG PO TABS: 350.0000 mg | ORAL_TABLET | Freq: Four times a day (QID) | ORAL | 0 refills | Status: DC | PRN

## 2017-08-10 ENCOUNTER — Other Ambulatory Visit: Payer: Self-pay | Admitting: Family Medicine

## 2017-08-10 DIAGNOSIS — E038 Other specified hypothyroidism: Secondary | ICD-10-CM

## 2017-08-24 ENCOUNTER — Other Ambulatory Visit: Payer: Self-pay | Admitting: Family Medicine

## 2017-08-24 ENCOUNTER — Encounter: Payer: Self-pay | Admitting: Family Medicine

## 2017-08-24 DIAGNOSIS — S39012A Strain of muscle, fascia and tendon of lower back, initial encounter: Secondary | ICD-10-CM

## 2017-08-24 MED ORDER — CARISOPRODOL 350 MG PO TABS: 350.0000 mg | ORAL_TABLET | Freq: Four times a day (QID) | ORAL | 0 refills | Status: DC | PRN

## 2017-08-24 NOTE — Telephone Encounter (Signed)
Is this ok to refill?  

## 2017-09-21 ENCOUNTER — Other Ambulatory Visit: Payer: Self-pay | Admitting: Family Medicine

## 2017-09-21 DIAGNOSIS — S39012A Strain of muscle, fascia and tendon of lower back, initial encounter: Secondary | ICD-10-CM

## 2017-09-21 NOTE — Telephone Encounter (Signed)
Is this okay to refill? 

## 2018-03-22 ENCOUNTER — Telehealth: Payer: Self-pay

## 2018-03-22 NOTE — Telephone Encounter (Signed)
Called pt to confirm he still lives in the area per [place]. [place]

## 2018-03-30 ENCOUNTER — Encounter: Payer: Self-pay | Admitting: Family Medicine

## 2018-03-30 ENCOUNTER — Ambulatory Visit (INDEPENDENT_AMBULATORY_CARE_PROVIDER_SITE_OTHER): Payer: Medicare Other | Admitting: Family Medicine

## 2018-03-30 VITALS — BP 126/80 | HR 77 | Temp 98.1°F | Wt 223.8 lb

## 2018-03-30 DIAGNOSIS — M7711 Lateral epicondylitis, right elbow: Secondary | ICD-10-CM | POA: Diagnosis not present

## 2018-03-30 DIAGNOSIS — F329 Major depressive disorder, single episode, unspecified: Secondary | ICD-10-CM | POA: Diagnosis not present

## 2018-03-30 DIAGNOSIS — N529 Male erectile dysfunction, unspecified: Secondary | ICD-10-CM

## 2018-03-30 DIAGNOSIS — M7712 Lateral epicondylitis, left elbow: Secondary | ICD-10-CM | POA: Diagnosis not present

## 2018-03-30 DIAGNOSIS — F32A Depression, unspecified: Secondary | ICD-10-CM

## 2018-03-30 MED ORDER — BUPROPION HCL ER (SR) 150 MG PO TB12: 150.0000 mg | ORAL_TABLET | Freq: Every day | ORAL | 5 refills | Status: DC

## 2018-03-30 NOTE — Patient Instructions (Signed)
Do his many things as you can palms up and open. Heat for 20 minutes 3 times per day and Indocin twice per day to help quiet the sling down

## 2018-03-30 NOTE — Progress Notes (Signed)
   Subjective:    Patient ID: [name] [name], male    DOB: 05-01-1950, 68 y.o.   MRN: [id number]  HPI He is here for 2 issues.  He has been doing a lot of yard work and has experienced bilateral elbow discomfort.  This is been going on for several months.  He also has underlying erectile dysfunction and does find sildenafil does help.  He is also noted difficulty with ejaculation.  He is now taking 150 mg of Effexor daily.  He is concerned that this could be causing the ejaculatory issue.   Review of Systems     Objective:   Physical Exam Alert and in no distress.  Tender to palpation over both lateral epicondyles with provocative testing making this worse.       Assessment & Plan:  Depression, unspecified depression type - Plan: buPROPion (WELLBUTRIN SR) 150 MG 12 hr tablet  Erectile dysfunction, unspecified erectile dysfunction type  Lateral epicondylitis of both elbows Recommend conservative care for the epicondylitis with doing his main things as you can palms up and open.  Also heat to the area and Indocin twice per day.  Discussed possible referral to physical therapy if continued difficulty. He will also taper off the Effexor going down to 75 mg for the next week or 2 and I will add Wellbutrin.  He will call me in

## 2018-04-22 ENCOUNTER — Other Ambulatory Visit: Payer: Self-pay | Admitting: Family Medicine

## 2018-04-22 DIAGNOSIS — F329 Major depressive disorder, single episode, unspecified: Secondary | ICD-10-CM

## 2018-04-22 DIAGNOSIS — F32A Depression, unspecified: Secondary | ICD-10-CM

## 2018-04-22 NOTE — Telephone Encounter (Signed)
[facility] Please advise [facility] 

## 2018-06-02 ENCOUNTER — Other Ambulatory Visit: Payer: Self-pay | Admitting: Family Medicine

## 2018-06-02 DIAGNOSIS — I10 Essential (primary) hypertension: Secondary | ICD-10-CM

## 2018-06-02 DIAGNOSIS — E038 Other specified hypothyroidism: Secondary | ICD-10-CM

## 2018-06-08 ENCOUNTER — Encounter: Payer: Self-pay | Admitting: Family Medicine

## 2018-06-26 ENCOUNTER — Other Ambulatory Visit: Payer: Self-pay | Admitting: Family Medicine

## 2018-06-26 DIAGNOSIS — M459 Ankylosing spondylitis of unspecified sites in spine: Secondary | ICD-10-CM

## 2018-06-28 ENCOUNTER — Other Ambulatory Visit: Payer: Self-pay | Admitting: Family Medicine

## 2018-06-28 DIAGNOSIS — I10 Essential (primary) hypertension: Secondary | ICD-10-CM

## 2018-06-29 ENCOUNTER — Other Ambulatory Visit: Payer: Self-pay | Admitting: Family Medicine

## 2018-07-06 ENCOUNTER — Other Ambulatory Visit: Payer: Self-pay | Admitting: Family Medicine

## 2018-07-06 DIAGNOSIS — I10 Essential (primary) hypertension: Secondary | ICD-10-CM

## 2018-07-07 ENCOUNTER — Encounter: Payer: Self-pay | Admitting: Family Medicine

## 2018-07-11 ENCOUNTER — Other Ambulatory Visit: Payer: Self-pay | Admitting: Family Medicine

## 2018-07-11 DIAGNOSIS — F329 Major depressive disorder, single episode, unspecified: Secondary | ICD-10-CM

## 2018-07-11 DIAGNOSIS — F32A Depression, unspecified: Secondary | ICD-10-CM

## 2018-07-12 NOTE — Telephone Encounter (Signed)
Please advise if med can be fill [facility]

## 2018-07-12 NOTE — Telephone Encounter (Signed)
[facility] effexor. Please advise [facility] 

## 2018-08-04 ENCOUNTER — Other Ambulatory Visit: Payer: Self-pay | Admitting: Family Medicine

## 2018-08-04 DIAGNOSIS — I10 Essential (primary) hypertension: Secondary | ICD-10-CM

## 2018-08-05 ENCOUNTER — Other Ambulatory Visit: Payer: Self-pay | Admitting: Family Medicine

## 2018-08-05 DIAGNOSIS — F329 Major depressive disorder, single episode, unspecified: Secondary | ICD-10-CM

## 2018-08-05 DIAGNOSIS — F32A Depression, unspecified: Secondary | ICD-10-CM

## 2018-08-05 NOTE — Telephone Encounter (Signed)
Is this ok to refill?  

## 2018-09-12 ENCOUNTER — Other Ambulatory Visit: Payer: Self-pay | Admitting: Family Medicine

## 2018-09-12 DIAGNOSIS — I10 Essential (primary) hypertension: Secondary | ICD-10-CM

## 2018-09-19 ENCOUNTER — Other Ambulatory Visit: Payer: Self-pay

## 2018-10-25 ENCOUNTER — Other Ambulatory Visit: Payer: Self-pay | Admitting: Family Medicine

## 2018-10-25 DIAGNOSIS — I10 Essential (primary) hypertension: Secondary | ICD-10-CM

## 2018-10-26 ENCOUNTER — Encounter: Payer: Medicare Other | Admitting: Family Medicine

## 2018-12-15 ENCOUNTER — Other Ambulatory Visit: Payer: Self-pay | Admitting: Family Medicine

## 2018-12-15 DIAGNOSIS — I10 Essential (primary) hypertension: Secondary | ICD-10-CM

## 2018-12-26 DIAGNOSIS — Z20828 Contact with and (suspected) exposure to other viral communicable diseases: Secondary | ICD-10-CM | POA: Diagnosis not present

## 2018-12-26 DIAGNOSIS — Z23 Encounter for immunization: Secondary | ICD-10-CM | POA: Diagnosis not present

## 2019-02-03 ENCOUNTER — Other Ambulatory Visit: Payer: Self-pay | Admitting: Family Medicine

## 2019-02-03 DIAGNOSIS — I10 Essential (primary) hypertension: Secondary | ICD-10-CM

## 2019-02-18 ENCOUNTER — Other Ambulatory Visit: Payer: Self-pay | Admitting: Family Medicine

## 2019-02-18 DIAGNOSIS — E038 Other specified hypothyroidism: Secondary | ICD-10-CM

## 2019-02-20 NOTE — Telephone Encounter (Signed)
Pt advised he is moving and would like to know if we would be able to put in an order for labs and mail them to him and he get them drawn near him . Please advise . [place]

## 2019-02-21 ENCOUNTER — Other Ambulatory Visit: Payer: Self-pay | Admitting: Family Medicine

## 2019-02-21 DIAGNOSIS — E038 Other specified hypothyroidism: Secondary | ICD-10-CM

## 2019-02-21 NOTE — Telephone Encounter (Signed)
[facility] [place] [facility] refill on Sumatriptan pt. Last apt. 10/26/18.

## 2019-02-22 NOTE — Telephone Encounter (Signed)
Left message for pt to call back to schedule an appt 

## 2019-02-23 ENCOUNTER — Telehealth: Payer: Self-pay

## 2019-02-23 MED ORDER — SUMATRIPTAN SUCCINATE 100 MG PO TABS: ORAL_TABLET | ORAL | 2 refills | Status: AC

## 2019-02-23 NOTE — Telephone Encounter (Signed)
[facility] [place] [facility] refill on Sumatriptan pt. Last apt. 03/30/18.

## 2019-04-11 ENCOUNTER — Encounter: Payer: Self-pay | Admitting: Family Medicine

## 2019-04-11 ENCOUNTER — Other Ambulatory Visit: Payer: Self-pay | Admitting: Family Medicine

## 2019-04-11 DIAGNOSIS — E038 Other specified hypothyroidism: Secondary | ICD-10-CM

## 2019-04-11 MED ORDER — LEVOTHYROXINE SODIUM 100 MCG PO TABS: 100.0000 ug | ORAL_TABLET | Freq: Every day | ORAL | 0 refills | Status: AC

## 2019-04-12 DIAGNOSIS — N529 Male erectile dysfunction, unspecified: Secondary | ICD-10-CM | POA: Diagnosis not present

## 2019-04-12 DIAGNOSIS — E039 Hypothyroidism, unspecified: Secondary | ICD-10-CM | POA: Diagnosis not present

## 2019-04-12 DIAGNOSIS — I10 Essential (primary) hypertension: Secondary | ICD-10-CM | POA: Diagnosis not present

## 2019-04-12 DIAGNOSIS — M459 Ankylosing spondylitis of unspecified sites in spine: Secondary | ICD-10-CM | POA: Diagnosis not present

## 2019-04-12 DIAGNOSIS — F329 Major depressive disorder, single episode, unspecified: Secondary | ICD-10-CM | POA: Diagnosis not present

## 2019-04-12 DIAGNOSIS — G43909 Migraine, unspecified, not intractable, without status migrainosus: Secondary | ICD-10-CM | POA: Diagnosis not present

## 2019-04-12 DIAGNOSIS — F5232 Male orgasmic disorder: Secondary | ICD-10-CM | POA: Diagnosis not present

## 2019-04-19 DIAGNOSIS — Z888 Allergy status to other drugs, medicaments and biological substances status: Secondary | ICD-10-CM | POA: Diagnosis not present

## 2019-04-19 DIAGNOSIS — G43909 Migraine, unspecified, not intractable, without status migrainosus: Secondary | ICD-10-CM | POA: Diagnosis not present

## 2019-04-24 DIAGNOSIS — G43909 Migraine, unspecified, not intractable, without status migrainosus: Secondary | ICD-10-CM | POA: Diagnosis not present

## 2019-04-25 DIAGNOSIS — F5232 Male orgasmic disorder: Secondary | ICD-10-CM | POA: Diagnosis not present

## 2019-04-25 DIAGNOSIS — N529 Male erectile dysfunction, unspecified: Secondary | ICD-10-CM | POA: Diagnosis not present

## 2019-05-11 DIAGNOSIS — F329 Major depressive disorder, single episode, unspecified: Secondary | ICD-10-CM | POA: Diagnosis not present

## 2019-05-11 DIAGNOSIS — G43909 Migraine, unspecified, not intractable, without status migrainosus: Secondary | ICD-10-CM | POA: Diagnosis not present

## 2019-05-11 DIAGNOSIS — E782 Mixed hyperlipidemia: Secondary | ICD-10-CM | POA: Diagnosis not present

## 2019-05-11 DIAGNOSIS — N529 Male erectile dysfunction, unspecified: Secondary | ICD-10-CM | POA: Diagnosis not present

## 2019-05-11 DIAGNOSIS — M459 Ankylosing spondylitis of unspecified sites in spine: Secondary | ICD-10-CM | POA: Diagnosis not present

## 2019-05-11 DIAGNOSIS — E039 Hypothyroidism, unspecified: Secondary | ICD-10-CM | POA: Diagnosis not present

## 2019-05-11 DIAGNOSIS — I10 Essential (primary) hypertension: Secondary | ICD-10-CM | POA: Diagnosis not present

## 2019-05-11 DIAGNOSIS — F5232 Male orgasmic disorder: Secondary | ICD-10-CM | POA: Diagnosis not present

## 2019-05-12 ENCOUNTER — Other Ambulatory Visit: Payer: Self-pay | Admitting: Family Medicine

## 2019-05-12 DIAGNOSIS — I10 Essential (primary) hypertension: Secondary | ICD-10-CM

## 2019-05-12 NOTE — Telephone Encounter (Signed)
Pt has transferred out

## 2019-05-29 DIAGNOSIS — I10 Essential (primary) hypertension: Secondary | ICD-10-CM | POA: Diagnosis not present

## 2019-06-10 ENCOUNTER — Other Ambulatory Visit: Payer: Self-pay | Admitting: Family Medicine

## 2019-06-12 NOTE — Telephone Encounter (Signed)
[facility] iss requesting to fill pt imitrex. Pt looks like he does not have a PCP listed. Please advise [facility]

## 2019-07-04 ENCOUNTER — Other Ambulatory Visit: Payer: Self-pay | Admitting: Family Medicine

## 2019-07-04 DIAGNOSIS — I10 Essential (primary) hypertension: Secondary | ICD-10-CM

## 2019-07-06 ENCOUNTER — Other Ambulatory Visit: Payer: Self-pay | Admitting: Family Medicine

## 2019-07-06 DIAGNOSIS — M459 Ankylosing spondylitis of unspecified sites in spine: Secondary | ICD-10-CM

## 2019-07-06 NOTE — Telephone Encounter (Signed)
[facility] is requesting to [facility] pt indomethacin. Please advise kh

## 2019-09-07 ENCOUNTER — Other Ambulatory Visit: Payer: Self-pay | Admitting: Family Medicine

## 2019-09-07 DIAGNOSIS — M459 Ankylosing spondylitis of unspecified sites in spine: Secondary | ICD-10-CM

## 2019-09-07 NOTE — Telephone Encounter (Signed)
[facility] requesting to [facility] pt ind[facility]. Looks as if you are not pt PCP. Please advise [facility]

## 2019-09-12 ENCOUNTER — Other Ambulatory Visit: Payer: Self-pay | Admitting: Family Medicine

## 2019-09-12 DIAGNOSIS — M459 Ankylosing spondylitis of unspecified sites in spine: Secondary | ICD-10-CM

## 2019-09-12 NOTE — Telephone Encounter (Signed)
Please advise if we can fill pt indomethacin. Pt has moved and was supposed to have a new doctor by now. [place]

## 2022-08-21 ENCOUNTER — Encounter: Payer: Self-pay | Admitting: Internal Medicine
